# Patient Record
Sex: Male | Born: 1955
Health system: Southern US, Community
[De-identification: ages and names within clinical notes are randomized; demographics above are authoritative.]

## PROBLEM LIST (undated history)

## (undated) DIAGNOSIS — D689 Coagulation defect, unspecified: Secondary | ICD-10-CM

## (undated) DIAGNOSIS — R0989 Other specified symptoms and signs involving the circulatory and respiratory systems: Secondary | ICD-10-CM

## (undated) DIAGNOSIS — K469 Unspecified abdominal hernia without obstruction or gangrene: Secondary | ICD-10-CM

## (undated) DIAGNOSIS — I10 Essential (primary) hypertension: Secondary | ICD-10-CM

## (undated) DIAGNOSIS — Z973 Presence of spectacles and contact lenses: Secondary | ICD-10-CM

## (undated) DIAGNOSIS — I2699 Other pulmonary embolism without acute cor pulmonale: Secondary | ICD-10-CM

## (undated) DIAGNOSIS — IMO0002 Reserved for concepts with insufficient information to code with codable children: Secondary | ICD-10-CM

## (undated) DIAGNOSIS — Z86718 Personal history of other venous thrombosis and embolism: Secondary | ICD-10-CM

## (undated) DIAGNOSIS — D6851 Activated protein C resistance: Secondary | ICD-10-CM

## (undated) DIAGNOSIS — B356 Tinea cruris: Secondary | ICD-10-CM

## (undated) DIAGNOSIS — I499 Cardiac arrhythmia, unspecified: Secondary | ICD-10-CM

## (undated) HISTORY — DX: Unspecified abdominal hernia without obstruction or gangrene: K46.9

## (undated) HISTORY — DX: Other specified symptoms and signs involving the circulatory and respiratory systems: R09.89

## (undated) HISTORY — DX: Reserved for concepts with insufficient information to code with codable children: IMO0002

## (undated) HISTORY — DX: Coagulation defect, unspecified: D68.9

## (undated) HISTORY — DX: Other pulmonary embolism without acute cor pulmonale: I26.99

## (undated) HISTORY — DX: Activated protein C resistance: D68.51

## (undated) HISTORY — DX: Personal history of other venous thrombosis and embolism: Z86.718

## (undated) HISTORY — PX: OTHER SURGICAL HISTORY: SHX169

## (undated) HISTORY — DX: Presence of spectacles and contact lenses: Z97.3

## (undated) HISTORY — PX: HAND SURGERY: SHX662

---

## 1995-03-19 DIAGNOSIS — I2699 Other pulmonary embolism without acute cor pulmonale: Secondary | ICD-10-CM

## 1995-03-19 HISTORY — PX: OTHER SURGICAL HISTORY: SHX169

## 1995-03-19 HISTORY — DX: Other pulmonary embolism without acute cor pulmonale: I26.99

## 1998-10-24 ENCOUNTER — Ambulatory Visit: Admission: RE | Admit: 1998-10-24 | Discharge: 1998-10-24 | Payer: Self-pay | Admitting: Internal Medicine

## 2007-01-14 ENCOUNTER — Encounter (HOSPITAL_BASED_OUTPATIENT_CLINIC_OR_DEPARTMENT_OTHER): Admission: RE | Admit: 2007-01-14 | Discharge: 2007-03-24 | Payer: Self-pay | Admitting: Surgery

## 2007-02-02 ENCOUNTER — Ambulatory Visit: Payer: Self-pay | Admitting: Surgery

## 2007-02-23 ENCOUNTER — Ambulatory Visit: Payer: Self-pay | Admitting: Vascular Surgery

## 2007-03-02 ENCOUNTER — Ambulatory Visit: Payer: Self-pay | Admitting: Vascular Surgery

## 2007-03-30 ENCOUNTER — Ambulatory Visit: Payer: Self-pay | Admitting: Vascular Surgery

## 2007-10-20 ENCOUNTER — Ambulatory Visit: Payer: Self-pay | Admitting: Vascular Surgery

## 2010-05-07 ENCOUNTER — Encounter: Payer: Self-pay | Admitting: Vascular Surgery

## 2010-05-24 ENCOUNTER — Encounter (INDEPENDENT_AMBULATORY_CARE_PROVIDER_SITE_OTHER): Payer: BC Managed Care – PPO

## 2010-05-24 DIAGNOSIS — Z48812 Encounter for surgical aftercare following surgery on the circulatory system: Secondary | ICD-10-CM

## 2010-05-24 DIAGNOSIS — I8310 Varicose veins of unspecified lower extremity with inflammation: Secondary | ICD-10-CM

## 2010-05-28 ENCOUNTER — Encounter (INDEPENDENT_AMBULATORY_CARE_PROVIDER_SITE_OTHER): Payer: BC Managed Care – PPO | Admitting: Vascular Surgery

## 2010-05-28 DIAGNOSIS — I83893 Varicose veins of bilateral lower extremities with other complications: Secondary | ICD-10-CM

## 2010-05-28 NOTE — Assessment & Plan Note (Signed)
OFFICE VISIT  RODDY, BELLAMY DOB:  04-11-1955                                       05/28/2010 ZOXWR#:[id number]  The patient returns today for worsening of his venous insufficiency of the right leg for which he was treated in 2008.  He had laser ablation of his right great saphenous vein for recurrent venous stasis ulcer and severe venous hypertension secondary to valvular reflux in the right great saphenous system.  He had a good result early with immediate evidence of total closure of the right great saphenous vein although several months later a duplex exam did reveal partial recanalization of the proximal right great saphenous vein but closure of the distal right great saphenous vein.  He remains asymptomatic and had no recurrence of the stasis ulcer.  He also noticed that the skin became lighter in color in the area of hyperpigmentation.  Over the last 6 months he has continued to wear long-leg elastic compression stockings but has had increasing episodes of sharp pain beneath the thigh and calf area as well as bulging varicosities which were not present in the mid to distal thigh as well as darkening of the skin.  He has had no recurrence of stasis ulcer and has had no bleeding but has had worsening of the edema and pain.  This has worsened despite the use of stockings and ibuprofen and elevation of the legs.  He has no symptoms of the contralateral left leg.  This patient does have a factor V Leiden deficiency and is on chronic Coumadin therapy.  Previously we did not discontinue his Coumadin completely but did hold it for a few days with his previous procedure and he tolerated that well.  He does have a remote history of DVT and pulmonary embolism many years ago.  He originally had ligation and stripping of the vein in [place] in 1997.  CHRONIC MEDICAL PROBLEMS: 1. Factor V Leiden. 2. History of PE. 3. History of DVT. 4. Denies any coronary  artery disease, diabetes or stroke.  FAMILY HISTORY:  Noncontributory.  SOCIAL HISTORY:  He is married, works as a Medical illustrator.  Does not smoke.  REVIEW OF SYSTEMS:  He denies any chest pain, dyspnea on exertion, PND, orthopnea or hemoptysis.  All other systems are negative.  PHYSICAL EXAM:  Vital signs:  Blood pressure is 140/80, heart rate 70, respirations 15.  General:  He is a well-developed, well-nourished male in no apparent distress, alert and oriented x3.  HEENT:  Normal for age. EOMs intact.  Lungs:  Clear to auscultation.  No rhonchi or wheezing. Cardiovascular:  Regular rhythm without murmurs.  Carotid pulses 3+, no audible bruits.  Abdomen:  Soft, nontender with no palpable masses. Lower extremity exam reveals 3+ femoral, popliteal and dorsalis pedis pulses bilaterally.  Left leg is unremarkable.  Right leg has severe hyperpigmentation the lower third of the leg medially with no active venous stasis ulcers.  He has 1+ edema.  He has bulging varicosities in the right mid to distal thigh medially and extending over the patella and into the right medial calf area of the great saphenous system.  Today I ordered venous duplex exam which I reviewed and interpreted.  He has total recanalization of the left great saphenous vein from the saphenofemoral junction proximal to the mid thigh which is supplying these medial varicosities.  There is also a large branch of equal size (anterior accessory branch) which communicates just distal to the saphenofemoral junction and also has gross reflux feeding other varicosities in the anterior thigh.  He has some reflux in the small saphenous vein of the right calf.  I visualized the veins personally with the SonoSite today and believe that for complete treatment he needs closure of both the anterior accessory branch and the right great saphenous vein as well as stab phlebectomies which we should perform as a single procedure.  He  has definitely developed recurrent symptoms which are affecting his daily living despite good medical management and we will proceed with precertification to perform this in the near future.    Quita Skye [name], M.D. Electronically Signed  JDL/MEDQ  D:  05/28/2010  T:  05/28/2010  Job:  [id number]

## 2010-06-04 NOTE — Procedures (Unsigned)
LOWER EXTREMITY VENOUS REFLUX EXAM  INDICATION:  Followup right great saphenous vein ablation 02/2007.  EXAM:  Using color-flow imaging and pulse Doppler spectral analysis, the right common femoral, superficial femoral, popliteal, posterior tibial, greater and small saphenous veins were evaluated.  There is evidence suggesting deep venous insufficiency in the right lower extremity.  The right saphenofemoral junction is not competent with reflux of >500 milliseconds.  The right GSV is not competent with reflux of >500 milliseconds with the caliber as described below.  The right proximal short saphenous vein demonstrates incompetency.  GSV Diameter (used if found to be incompetent only)                                           Right    Left Proximal Greater Saphenous Vein           1.25. cm cm Proximal-to-mid-thigh                     0.53 cm  cm Mid thigh                                 0.74 cm  cm Mid-distal thigh                          cm       cm Distal thigh                              0.89 cm  cm Knee                                      0.47 cm  cm  IMPRESSION: 1. The right great saphenous vein is not competent with reflux of >500     milliseconds. 2. The right great saphenous vein is tortuous. 3. The deep venous system is not competent with reflux of >500     milliseconds. 4. The right small saphenous vein is not competent with reflux of >500     milliseconds.  ___________________________________________ Quita Skye [name], M.D.  EM/MEDQ  D:  05/25/2010  T:  05/25/2010  Job:  [id number]

## 2010-06-18 ENCOUNTER — Encounter: Payer: Self-pay | Admitting: Vascular Surgery

## 2010-06-26 ENCOUNTER — Other Ambulatory Visit: Payer: BC Managed Care – PPO | Admitting: Vascular Surgery

## 2010-07-03 ENCOUNTER — Ambulatory Visit: Payer: BC Managed Care – PPO | Admitting: Vascular Surgery

## 2010-07-31 NOTE — Assessment & Plan Note (Signed)
OFFICE VISIT   Todd Gardner, Todd Gardner  DOB:  [birth year]                                       02/02/2007  WFUXN#:[id number]   REASON FOR VISIT:  Right venous stasis ulcer.   HISTORY:  This is a 55 year old gentleman I am seeing at the request of  Dr. Tanda Rockers for evaluation of right lower extremity venous stasis ulcer.  The patient has had 2 prior ulcers in 2002 and 2006.  This is his third  ulcer, which has been present for approximately 6 weeks.  In the past,  he has undergone vein stripping in [place].  This was done in 1997.  He  is currently managing his ulcer at the [facility] Center with compression  dressing.  He states that the ulcer has not changed significantly in the  past 6 weeks.   The patient has been compliant with wearing graduated compression  stockings, 20-30 mm of compression of thigh-high length for the past 7  years.  He has developed these ulcers despite religiously wearing  compression.  He now requires a Fentanyl patch, as well as hydrocodone  p.r.n. for pain management.  He states that there has been some bleeding  on the bandage from when the dressing change is performed.  The patient  has a history of Factor V Leiden deficiency, for which he takes  Coumadin.  He has had a DVT and pulmonary embolism in the past.   REVIEW OF SYSTEMS:  GENERAL:  No weight gain or weight loss.  CARDIAC:  Negative.  PULMONARY:  Negative.  GASTROINTESTINAL:  Negative.  GENITOURINARY:  Negative.  VASCULAR:  Pain in the legs with walking.  Pain in the feet with lying  flat, and non-healing ulcers.  Positive for blood clots and phlebitis in  the past.  NEURO:  Negative.  ORTHO:  Negative.  PSYCH:  Negative.  ENT:  Negative.  HEMATOLOGICAL:  Positive for Factor V Leiden mutation.   PAST MEDICAL HISTORY:  Significant for Factor V Leiden mutation, history  of a PE, and a history of recurrent venous stasis ulcers.   PAST SURGICAL HISTORY:  Right greater  saphenous vein stripping in  [place] in 1997.   FAMILY HISTORY:  Noncontributory.   SOCIAL HISTORY:  He is married.  He works as a Medical illustrator.  Does not  smoke.  Has never smoked.  Drinks occasional alcohol.   MEDICATIONS:  Hydrocodone.  Fentanyl.  Coumadin.   ALLERGIES:  Questionable allergy to local anesthetic.   PHYSICAL EXAM:  Vital signs:  Blood pressure 143/92, respirations 18,  pulse 70.  In general, well-appearing in no acute distress.  HEENT,  normocephalic, atraumatic.  Pupils are anicteric.  Neck is supple  without mass.  Cardiovascular is regular rate and rhythm.  Respirations  nonlabored.  Psych, he is alert and oriented x3.  Neuro, cranial nerves  2-12 are grossly intact.  Extremities, the right leg has a moderate  amount of swelling.  There was an approximately 3x3 cm ulcer on the  medial aspect of the right ankle.  There was bleeding from the tissue  bed.  There is no gross evidence of cellulitis.  There is a biofilm  covering the surface of the ulcer.  There are multiple prominent  varicosities in the lower leg, particularly on the anterior medial side.  He has a palpable dorsalis  pedis pulse.  Hemosiderin deposits are  present on the medial side of the ankle.   DIAGNOSTIC STUDIES:  The patient underwent duplex today.  This reveals  reflux in the greater saphenous vein.  He also refluxes in the femoral  and femoral vein.  There is no evidence of DVT.   ASSESSMENT AND PLAN:  CEAP class 6 right ankle venous stasis ulcer in  the setting of venous insufficiency.  At this point, the patient has  failed nonoperative procedures.  He has developed his third recurrent  ulcer despite wearing thigh-high compression stockings religiously.  He  also has had bleeding from the ulcer bed, as well as a significant  dependence on narcotics for pain control.  He is currently in a 4-layer  Profore placed by Dr. Tanda Rockers at the [facility]ound Center.  At this point, I  would recommend  proceeding with laser ablation of the greater saphenous  vein as well as stab phlebectomy of his numerous varicosities in an  attempt to heal this wound and prevent future recurrence.   The patient does have a history of Factor V Leiden gene mutation for  which he is on Coumadin.  I will give him a prescription of Lovenox,  which he can use to come off of the Coumadin around the time of the  procedure.  The patient also states that he has had a bad reaction to  local anesthesia, which is most likely lidocaine.  This was associated  with severe pain and burning at the injection site.  For that reason, we  should probably try Marcaine for local anesthetic.  The patient will be  placed back in an [employer] today, and pending insurance approval, we  will proceed with saphenous ablation and varicosity ablation.   [name], MD  Electronically Signed   VWB/MEDQ  D:  02/02/2007  T:  02/03/2007  Job:  [id number]   cc:   Jake Shark A. Tanda Rockers, M.D.  Dario Guardian, M.D.

## 2010-07-31 NOTE — Assessment & Plan Note (Signed)
[facility]   NAME:  Todd Gardner, Todd Gardner                 ACCOUNT NO.:  [id number]   MEDICAL RECORD NO.:  [id number]      DATE OF BIRTH:  [birth year]   PHYSICIAN:  Theresia Majors. Tanda Rockers, M.D. VISIT DATE:  02/05/2007                                   OFFICE VISIT   SUBJECTIVE:  [name] is a 55 year old man with the venous stasis ulcer  involving the right medial ankle.  In the interim, he has  been seen in  consultation by Dr. Myra Gianotti IV at [facility]  in [place].  There has been a recommendation that the patient proceed  with laser ablation and stab phlebectomy.  In the interim, the patient  has discontinued the use of the Duragesic patch due to symptoms of  nausea.  He is relatively comfortable by the resumption of his p.o.  medications.  There has been no fever.  There have been no  cardiorespiratory symptoms.   OBJECTIVE:  Blood pressure is 144/83, respirations 16, pulse rate 75,  temperature is 98.6.  Inspection of the right medial ankle shows that  the chronic ulcer is persistent.  There has been no obvious extension of  the ulcer volumes or depth.  There is mild hyperemia.  There is still  some moderate discomfort to deep palpation.  Capillary refill is brisk.  Pedal pulses 3+.   ASSESSMENT:  Persistent venous hypertension and secondary ulceration.   PLAN:  We have encouraged the patient strongly to continue with the  course that has been proposed by Dr. Myra Gianotti IV.  In addition, we have  encouraged him to continue the use of the external compression hose to  control edema. In addition, we have provided him with several swaths of  a quarter inch felt which may be used to bolus over the ulcer in an  attempt to locally increase the cutaneous pressure.  Use of this  technique has been demonstrated to the patient to his satisfaction.  We  will continue to see him on a weekly basis for wound management until  which time his care will be  completely transferred to Dr. Juleen China IV, MD before his surgery and convalescence.  We have given the  patient opportunity to ask questions.  He seems to understand the  discussion. Expresses gratitude for having been seen in the clinic and  indicates that he will be compliant.      [name], M.D.  Electronically Signed     HAN/MEDQ  D:  02/05/2007  T:  02/05/2007  Job:  [id number]   cc:   [name], MD

## 2010-07-31 NOTE — Assessment & Plan Note (Signed)
[facility]   NAME:  Todd Gardner, ACHILLE                 ACCOUNT NO.:  [id number]   MEDICAL RECORD NO.:  [id number]      DATE OF BIRTH:  1955-06-30   PHYSICIAN:  Theresia Majors. Tanda Rockers, M.D. VISIT DATE:  01/29/2007                                   OFFICE VISIT   SUBJECTIVE:  Todd Gardner is a 55 year old man who we are evaluating for  recalcitrant stasis ulcers associated with postphlebitic changes.  In  the interim, we have treated him with an Unna boot compression.  He has  complained of level 10 pain.  He has been essentially incapacitated  since his last visit.  There has been no fever; however, there has been  moderate drainage prompting him to discontinue his wrap.   OBJECTIVE:  Blood pressure is 156/88, respirations 18, pulse rate 75,  temperature is 98.3.  Inspection of the right lower extremity shows the  chronic changes of stasis are persistent.  There is no evidence of  lymphangiitis, abscess or cellulitis.  The wound itself has a 100%  granulation with some minor desquamation at the periphery.  The pedal  pulse remains 3+.  There is 2+ edema   ASSESSMENT:  Stasis ulceration with severe pain related to venous  hypertension.   PLAN:  For pain relief, we will proceed with a Duragesic patch 50 mcg  every 72 hours.  The patient will continue his oxycodone for  breakthrough pain.  He has been encouraged to remain ambulatory, but to  avoid operating machinery, driving or participating in activities  requiring precise judgment.  We will continue to use compression in the  form of a Profore wrap with a Silverlon swath and Ultra Mide cream to  the skin.  We will also schedule this patient for an Apligraf in an  attempt to achieve closure.  He is also to continue to pursue the  consultation with the vein and vascular specialists to rule out the  concurrent presence of right iliac stenosis as the underlying culprit.   We have given the patient an opportunity to ask  questions regarding his  instruction and the clinical impression, he seems to understand,  expresses gratitude for having been seen in the clinic and indicates  that he will be compliant.      [name], M.D.  Electronically Signed     HAN/MEDQ  D:  01/29/2007  T:  01/29/2007  Job:  [id number]

## 2010-07-31 NOTE — Procedures (Signed)
DUPLEX DEEP VENOUS EXAM - LOWER EXTREMITY   INDICATION:  Follow up right greater saphenous vein ablation.   HISTORY:  Edema:  No.  Trauma/Surgery:  Yes.  Pain:  No.  PE:  Previous DVT:  No.  Anticoagulants:  Yes (Coumadin)  Other:   DUPLEX EXAM:                CFV   SFV   PopV  PTV    GSV                R  L  R  L  R  L  R   L  R  L  Thrombosis    o  o  o     o     o      +  Spontaneous   +  +  +     +     +      0  Phasic        +  +  +     +     +      0  Augmentation  +  +  +     +     +      0  Compressible  +  +  +     +     +      0  Competent     +  +  +     +     +      0   Legend:  + - yes  o - no  p - partial  D - decreased   IMPRESSION:  1. No evidence of deep venous thrombosis noted in the right leg.  2. Right greater saphenous vein, mid to distal, appears thrombosed,      proximal greater saphenous vein appears to have mild reflux.    _____________________________  Quita Skye. [name], M.D.   MG/MEDQ  D:  10/20/2007  T:  10/20/2007  Job:  [id number]

## 2010-07-31 NOTE — Assessment & Plan Note (Signed)
[facility]   NAME:  Todd Gardner, Todd Gardner NO.:  [id number]   MEDICAL RECORD NO.:  [id number]      DATE OF BIRTH:  1955/05/28   PHYSICIAN:  Maxwell Caul, M.D. VISIT DATE:  01/16/2007                                   OFFICE VISIT   HISTORY OF PRESENT ILLNESS:  Todd Gardner comes to [place] today as a new  patient.  He had been seen last week at [facility].  He did not realize that there was wound care center here.   The history he gives is that if he had long problems with venous stasis  and was having vein surgery in 1997 in [place], developed a pulmonary  embolism.  At that time, he was discovered to have Factor V Leiden, and  he has been Coumadin since.  He has had recurrent ulcers on his right  lower extremity.  He tells me he had a stasis ulcer some years ago on  his right medial leg that healed with compression.  Two years ago, he  had an ulcer in the same site as his current ulcer.  This healed with  weekly compression wraps which was done in [place].  This took roughly 8-  9 weeks.  Six weeks ago he developed an ulcer on the right medial ankle.  He has been treating this with graded pressure wraps in [place].  Unfortunately, his climbing around this was probably not very good as he  has been moving to [place] with his family from [place] and spent a  lot of time on his feet.  As mentioned, he was wrapped in [place] then  treated with Flagyl for coexistent infection, although, I do not have  any culture results.  He said this was anaerobic.  Last week he was  seen at [facility]  He was debrided, and he was being  treated with wraps that he is doing himself at home.   PAST MEDICAL HISTORY:  1. Reasonably simply is factor V Leiden with a history of pulmonary      embolism in 1997.  2. Recurrent venous stasis as discussion above.  3. Lumbar disk problems.   CURRENT MEDICATIONS:  Coumadin and  hydrocodone/APAP 5/500 one p.o. q.6 h  p.r.n. which he apparently took after debridement last week with very  little effect.  He was in extreme pain.   Socially, as mentioned, he is from [place], recently moved from  [place].  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  He is planning to make a career change here.  His wife is a  professor at [employer].   PHYSICAL EXAMINATION:  GENERAL:  A healthy, pleasant looking man in no  distress.  VITAL SIGNS:  Temperature is 97.9, pulse 59, respirations 16, blood  pressure 144/88.  He is not a diabetic.  RESPIRATORY:  Good air entry bilaterally.  CARDIAC:  Heart sounds are normal.  There is no murmurs.  No carotid  bruits.  Circulation:  His dorsalis pedis and posterior tibial pulses  were well felt without particular problems.   WOUND  EXAM:  One wound is on his right medial ankle, just under his  medial malleolus.  This measures 3 x 2.5 x 0.1.  There is no evidence of  surrounding infection.  The wound itself was covered with a tan-colored  eschar that underwent a full-thickness debridement with LET for  anesthesia hemostasis with direct pressure.  Underneath, this was a  granulating wound with a reasonably clean base.  There is a scant amount  of epithelialization here.  His leg had significant venous stasis  present but no other ulcers were noted.  His circulation was quite good  as noted above.   IMPRESSION:  1. Recurrent venous stasis ulcer on the right medial ankle.  This      underwent a full-thickness debridement as noted above.  We applied      Aquacel AG to the wound and wrapped him in a Profore wrap.  We will      see him again in a week's time.  He is familiar with this protocol.      He will call if there are any problems in the interim.           ______________________________  Maxwell Caul, M.D.     MGR/MEDQ  D:   01/16/2007  T:  01/16/2007  Job:  [id number]   cc:   Todd Gardner, M.D.

## 2010-07-31 NOTE — Assessment & Plan Note (Signed)
OFFICE VISIT   RAYE, SLYTER  DOB:  15-Feb-1956                                       02/16/2007  ZOXWR#:[id number]   REPORT TITLE:  Interim note   Mr. Castilleja has been approved for [place] to proceed with laser ablation of his  right greater saphenous vein for severe venous hypertension and a  history of multiple recurrent venous stasis ulcers in the right leg.  He  is on Coumadin for Factor V Leiden deficiency and has had previous DVT  and pulmonary embolus.  We will not discontinue his Coumadin but be  certain that the INR is not overly elevated, because it will be checked  this week.  He does not have a history of bleeding problems while on the  Coumadin.  And because he is at a risk of DVT and PE, we will maintain  the Coumadin.  It does not sound as if the burning discomfort from the  local anesthesia was an allergic reaction, so we will use Xylocaine with  epinephrine in the usual fashion for his ablation procedure.  I  discussed these situations with him by phone today, and he is ready to  proceed which we will schedule for next Monday, December 8.  He  understands that he will be at a slightly higher risk of some bleeding  from the procedure since he is on anticoagulation, but that the overall  risks will probably be less than discontinuing it with interim Lovenox.   Quita Skye [name], M.D.  Electronically Signed   JDL/MEDQ  D:  02/16/2007  T:  02/17/2007  Job:  [id number]

## 2010-07-31 NOTE — Assessment & Plan Note (Signed)
[facility]   NAME:  Todd Gardner, CASEBOLT                 ACCOUNT NO.:  [id number]   MEDICAL RECORD NO.:  [id number]      DATE OF BIRTH:  May 25, 1955   PHYSICIAN:  Theresia Majors. Tanda Rockers, M.D. VISIT DATE:  02/26/2007                                   OFFICE VISIT   SUBJECTIVE:  Todd Gardner underwent a laser ablation of the saphenous vein  residuals of the right lower extremity by Dr. Myra Gianotti  on February 23, 2007.  The patient kept his appointment although he has a follow-up  appointment with Dr. Myra Gianotti on March 06, 2007.  He is having some  mild discomfort after surgery, continues to wear a compressive hose.   OBJECTIVE:  Blood pressure is 120/81, respirations 18, pulse rate 82,  temperature is 98.7.  The patient is obviously in the early  postoperative state.  He has some areas of ecchymosis.  The wound  itself, even at this early date appears to have decreased significantly.  We abbreviated his exam due to the fact that he has had the recent  procedure.   ASSESSMENT:  Stasis ulcer immediately postop vein ablation by Dr.  Myra Gianotti.   PLAN:  We are discharging the patient from the Wound Center.  We have  encouraged him to keep his follow-up appointments as has been outlined  by Dr.  Myra Gianotti.  We have expressed a willingness to see him on a p.r.n.  basis.      [name], M.D.  Electronically Signed     HAN/MEDQ  D:  02/26/2007  T:  02/27/2007  Job:  [id number]

## 2010-07-31 NOTE — Assessment & Plan Note (Signed)
OFFICE VISIT   VITTORIO, MOHS  DOB:  [birth year]                                       03/30/2007  NFAOZ#:[id number]   Patient returns for further examination of the right lower extremity  stasis ulcer.  He had laser ablation of a greater saphenous vein  performed on December 8th and had prompt healing of the ulcer.   On examination today, the ulcer on the right ankle remains completely  healed, and the skin is beginning to soften.  The hyperpigmentation  appears to be changing slightly.  He has had no pain or discomfort in  the calf or thigh except for where the laser ablation procedure was  performed, and that seems to be almost resolved.  He has had no distal  edema.  He is wearing elastic compression stockings.   We photographed the site of the previous stasis ulcer today, and he will  return in six months for a formal venous duplex exam in the vascular lab  to check for reflux and to see me.   Quita Skye [name], M.D.  Electronically Signed   JDL/MEDQ  D:  03/30/2007  T:  03/31/2007  Job:  [id number]

## 2010-07-31 NOTE — Assessment & Plan Note (Signed)
[facility]   NAME:  Todd Gardner, Todd Gardner                 ACCOUNT NO.:  [id number]   MEDICAL RECORD NO.:  [id number]      DATE OF BIRTH:  07-29-1955   PHYSICIAN:  Theresia Majors. Tanda Rockers, M.D. VISIT DATE:  02/11/2007                                   OFFICE VISIT   SUBJECTIVE:  Todd Gardner is a 55 year old man whom we are following for  stasis ulcer involving the medial aspect of the right lower extremity.  The patient has been scheduled for vein ablation procedure by Dr.  Myra Gianotti the first week in December.  He returns for followup and  dressing change.  There has been no interim fever or excessive drainage.  Continues to be ambulatory.  He has utilized a modified compression  stocking.   OBJECTIVE:  Blood pressure is 129/90, respirations 16, pulse rate 80,  temperature is 98.7.  Inspection of the wound shows that the chronic changes of stasis are  persistent.  There is 2+ edema.  There is clean healthy-appearing  granulation in area of the previous ulcer with a hint of advancing  epithelium.  There is no evidence of ischemia or active infection.   ASSESSMENT:  Clinical response to compression.   PLAN:  We will continue to see the patient for interim evaluations until  he has his ablative vein surgery.  We have discussed the options of pain  management and the patient has been encouraged to continue the pain  regimens as indicated by Dr. Katrinka Blazing.  We have given him an opportunity to  ask questions.  He seems to understand the treatment plan.  He expresses  gratitude for having been seen in the clinic.  We will reevaluate him  pending the outcome of his venous ablative surgery.      [name], M.D.  Electronically Signed     HAN/MEDQ  D:  02/11/2007  T:  02/11/2007  Job:  [id number]   cc:   [name], MD  Dario Guardian, M.D.

## 2010-07-31 NOTE — Assessment & Plan Note (Signed)
OFFICE VISIT   DEANGLEO, PASSAGE  DOB:  02/19/1956                                       03/02/2007  EAVWU#:[id number]   Mr. Yeates in one week status post laser ablation of his right greater  saphenous vein. He had multiple stasis ulcers in the past and has had  previous venous surgery performed in [place]. He has been treated  actively for a venostasis ulcer at the right ankle and he has noticed  marked improvement just in the past 7 days since the procedure was  performed. The ulcerated area is healed and the area of erythema with  the skin replacement at the base of the ulcer now measures 5 x 4 cm with  no active ulcer infection. There is no edema in the ankle today. He  states that there is decreased eczema and scaliness involving the  hyperpigmented area, and that the skin seems more stabilized. He is  having moderate tenderness along the course of the greater saphenous  vein which is a quite large vein extending from the knee to the  saphenofemoral junction. Venous Duplex exam was performed by me which  reveals total occlusion of the saphenous vein from near the  saphenofemoral junction to the knee with the vein distally being patent.  The venous system is widely patent with no evidence of deep vein  thrombosis or obstruction. He was reassured regarding these findings.  Will return in 4 weeks for examination of the stasis ulcer and return in  6 months for a final venous Duplex exam. He will continue to wear the  long leg stockings for the next few weeks and then convert to the short  leg graduated compression stockings on a chronic basis.   Quita Skye [name], M.D.  Electronically Signed   JDL/MEDQ  D:  03/02/2007  T:  03/03/2007  Job:  [id number]   cc:   Jake Shark A. Tanda Rockers, M.D.  Dario Guardian, M.D.

## 2010-07-31 NOTE — Assessment & Plan Note (Signed)
OFFICE VISIT   MERVIN, RAMIRES  DOB:  22-Jul-1955                                       10/20/2007  EAVWU#:[id number]   The patient underwent laser ablation of his right great saphenous vein  with no stab phlebectomies on December 8 for a recurrent stasis ulcer in  the right ankle with severe hyperpigmentation.  He had dramatic  improvement with complete healing of the ulcer fairly rapidly.  The  hyperpigmentation in starting to face and his skin is softer with no  edema of the leg.  He states the leg felt like a new leg after 2 weeks  and could not be happier with the result.   PHYSICAL EXAMINATION:  On exam today there is no evidence of any  recurrent ulcer and skin is softer as noted with a palpable dorsalis  pedis pulse.  Venous duplex exam interestingly shows complete occlusion  of the great saphenous vein in the midportion of the leg and distally  but some recanalization of the great saphenous vein proximally near the  saphenofemoral junction with some reflux.   This does not seem to be clinically significant and I have discussed  this with him.  If he develops any recurrent symptoms or ulceration  obviously he will be back in touch with [place].   Quita Skye [name], M.D.  Electronically Signed   JDL/MEDQ  D:  10/20/2007  T:  10/21/2007  Job:  [id number]

## 2010-07-31 NOTE — Assessment & Plan Note (Signed)
[facility]   NAME:  Todd Gardner                 ACCOUNT NO.:  [id number]   MEDICAL RECORD NO.:  [id number]      DATE OF BIRTH:  1955/07/09   PHYSICIAN:  Todd Gardner, M.D. VISIT DATE:  01/23/2007                                   OFFICE VISIT   SUBJECTIVE:  Todd Gardner is a 55 year old man who is seen in followup for a  stasis ulceration involving his right lower extremity.  In the interim,  the patient has been treated with an Unna wrap.  He is required  hydrocodone APAP 5/500 every 6-8 hours for pain, and has increased this  frequency during the ambulatory periods.  There has been no interim  fever.   OBJECTIVE:  Blood pressure is 140/87, respirations 16, pulse rate 70,  temperature is 98.3.  Inspection of the right lower extremity in the supine position discloses  an area of ulceration with healthy-appearing granulation and advancing  epithelium.  There are chronic changes of stasis with hyperpigmentation  and induration with dermatoliposclerosis.  The pedal pulse remains  palpable.  When the patient stands there is extreme venous plethora and  tenseness of the lesser saphenous system with tributaries of 1-1/2 cm to  2 cm in diameter transverse in the lower leg.  These varicosities extend  onto the dorsum of the foot, and are prominent.  They are extremely  tense and they reproduce the pain upon palpation.  There are incisions  consistent with the saphenous vein-stripping both in the proximal and  distal thigh, and the midcalf medially.   ASSESSMENT:  Postphlebitic state with history of Leiden factor  deficiency.   PLAN:  We are recommending that this patient be referred to the vascular  surgery service for evaluation of possible iliac vein stenosis.  We will  defer any contrast study at this time.  We will continue to wrap his leg  on a weekly basis until he can be seen by the vein and vascular  specialist.   We have explained this approach to the  patient in terms that he seems to  understand.  He expresses gratitude for having been seen in the clinic,  and indicates that he is anxious to proceed with the consultation.      Todd Gardner, M.D.  Electronically Signed     HAN/MEDQ  D:  01/27/2007  T:  01/27/2007  Job:  [id number]   cc:   [facility]

## 2010-07-31 NOTE — Procedures (Signed)
LOWER EXTREMITY VENOUS REFLUX EXAM   INDICATION:  5 weeks of right foot ulcer.  The patient had right greater  saphenous vein stripped 10 years ago.   EXAM:  Using color-flow imaging and pulse Doppler spectral analysis, the  right common femoral, superficial femoral, popliteal, posterior tibial,  greater and lesser saphenous veins are evaluated.  There is evidence  suggesting deep venous insufficiency in the right common femoral vein  and superficial femoral vein.  The right saphenofemoral junction is not  competent.  The right greater saphenous vein is not competent with a  caliber as described below.   The right proximal short saphenous vein demonstrates competency.   GSV Diameter (used if found to be incompetent only)                                            Right    Left  Proximal Greater Saphenous Vein           1.2 cm   cm  Proximal-to-mid-thigh                     1.2 cm   cm  Mid thigh                                 1.2 cm   cm  Mid-distal thigh                          1.1 cm   cm  Distal thigh                              0.9 cm   cm  Knee                                      1 cm     cm   IMPRESSION:  1. Right greater saphenous vein reflux is identified with the caliber      ranging from 0.9 cm to 1.2 cm knee to groin.  2. 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  3. The right greater saphenous vein is not tortuous.  4. The deep venous system is not competent in the common femoral vein      and superficial femoral vein.  The right popliteal vein is      competent.  5. The right lesser saphenous vein is competent.   No evidence of right leg deep venous thrombosis, superficial venous  thrombus or Baker cyst.   ___________________________________________  V. Charlena Cross, MD   MC/MEDQ  D:  02/02/2007  T:  02/03/2007  Job:  [id number]

## 2010-09-25 ENCOUNTER — Encounter (INDEPENDENT_AMBULATORY_CARE_PROVIDER_SITE_OTHER): Payer: Self-pay | Admitting: General Surgery

## 2010-09-25 ENCOUNTER — Ambulatory Visit (INDEPENDENT_AMBULATORY_CARE_PROVIDER_SITE_OTHER): Payer: BC Managed Care – PPO | Admitting: General Surgery

## 2010-09-25 VITALS — BP 142/78 | HR 72 | Temp 96.8°F | Ht 75.0 in | Wt 222.6 lb

## 2010-09-25 DIAGNOSIS — D6851 Activated protein C resistance: Secondary | ICD-10-CM | POA: Insufficient documentation

## 2010-09-25 DIAGNOSIS — K4090 Unilateral inguinal hernia, without obstruction or gangrene, not specified as recurrent: Secondary | ICD-10-CM

## 2010-09-25 NOTE — Patient Instructions (Signed)
Please call our office when you are ready to schedule your operation.

## 2010-09-25 NOTE — Progress Notes (Addendum)
Subjective:     Patient ID: EMILY FORSE, male   DOB: October 21, 1955, 55 y.o.   MRN: [id number]    BP 142/78  Pulse 72  Temp 96.8 F (36 C)  Ht 6\' 3"  (1.905 m)  Wt 222 lb 9.6 oz (100.971 kg)  BMI 27.82 kg/m2    HPI This is a 55 year old male referred by Dr. Merri Brunette for evaluation of a left inguinal hernia. He noted some burning discomfort in the lower abdominal wall region on the left side. He went for his routine physical exam and was diagnosed with a left inguinal hernia. He's been sent over here for further evaluation and treatment. His brother had an inguinal hernia. He denies difficulty with urination. He denies constipation. No chronic cough.  Past Medical History  Diagnosis Date  . Hernia   . Poor circulation   . Hemorrhoids   . History of blood clots     Pulmonary embolism  . Wears glasses   . Factor V Leiden     positive  . Ulcer     leg    Past Surgical History  Procedure Date  . Vericose vein surgery   . Laser oblation     RLE saphenous vein   Allergies  Allergen Reactions  . Caine-1 (Lidocaine Hcl)   . Epinephrine   . Lanolin   . Lidocaine    Current outpatient prescriptions:warfarin (COUMADIN) 5 MG tablet, Take 5 mg by mouth daily.  , Disp: , Rfl:   History   Social History  . Marital Status: Married    Spouse Name: N/A    Number of Children: N/A  . Years of Education: N/A   Occupational History  . Not on file.   Social History Main Topics  . Smoking status: Never Smoker   . Smokeless tobacco: Not on file  . Alcohol Use: 1.2 oz/week    2 Cans of beer per week  . Drug Use: No  . Sexually Active:    Other Topics Concern  . Not on file   Social History Narrative  . No narrative on file   General:  _X_Normal __Fever__Weight change __Night sweats __Fatigue __Sleep loss  Breast:  _X_Normal __Lump __Pain __Nipple discharge __Infection __Other  Infectious Diseases:  _X_Normal __HIV/AIDS __Tuberculosis __Hepatitis A       __ Hepatitis B  __Hepatitis C __ STD __MRSA(staph infection) __Other  Dental:  _X_Normal __Dentures __Other  Cardiac: __Normal  __Pacemaker __Defibrillator __Bypass surgery __High blood pressure      __ Peripheral vascular disease __Heart attack __Irregular heart beat __Valve   Disease      _X_Varicose veins    Pulmonary:  _X_Normal __Cough/Sputum __Bronchitis __Asthma __COPD                    __Short of breath __Pneumonia __Sleep Apnea  Endocrine:  _X_Normal __Diabetes __Thyroid disease __High Cholesterol __Other  Skin:  _X_Normal  __Rash __Bruise easily __Cancer __Abnormal moles __Other  Gastrointestinal:  _X_Normal __Nausea/Vomiting/Diarrhea __Colon polyp or Cancer       __Irritable Bowel disease __Poor appetite __Hiatal hernia or reflux __Ulcer       __Liver disease __Abdominal pain __Hernia __Rectal bleeding or hemorrhoids       __Other  Genitourinary:  _X_Normal __Kidney disease or stones __Prostate problems        __Blood in urine __Difficulty voiding __Incontinence/leakage __Other  Neurological:  _X_Normal __Stroke __Paralysis __Seizure __Alzheimers disease       __Headaches __Dizziness __Fainting __Weakness __Other  Hematologic/Lymphatic:  __Normal __Bleeding disorder _X_Blood clots __Anemia       __Swollen lymph nodes __Blood Transfusion __Other  Immune System:  _X_Normal __Previous/Current Cancer-type:  __Other  HEENT:  __Normal __Hearing loss  __Hearing aid __Ear infection __Nose bleed       __Hoarseness __Sore throat __Blurred or double vision _X_Glasses or contacts       __Glaucoma __Retinopathy __Macular degeneration __Other  Musculoskeletal:  _X_Normal __Joint pain __Arthritis __Other  OB/GYN:  __Normal __Vaginal bleeding or discharge __Currently pregnant___        Trying to conceive___ 1st period age ___ Age at 89st pregnancy       ___ Breast feeding___  Menopause___ Hormones___        Breast cancer in family___        Review of Systems     Objective:   Physical  Exam  Constitutional: He appears well-developed and well-nourished. No distress.  Eyes: Conjunctivae and EOM are normal.  Neck: Normal range of motion. Neck supple.  Cardiovascular: Normal rate and regular rhythm.   No murmur heard. Pulmonary/Chest: Effort normal and breath sounds normal.  Abdominal: Soft. Bowel sounds are normal. He exhibits no distension and no mass. There is no tenderness.  Genitourinary: Penis normal.       There is a left inguinal bulge that is reducible in the supine position. The right inguinal floor is solid.  Musculoskeletal: He exhibits no edema.       Right-sided varicosities are noted.  Skin: Skin is warm and dry.       Assessment:     Left inguinal hernia that is symptomatic. Has factor V Leiden deficiency and is on chronic Coumadin for this. He is due to to get an operation on his right lower extremity varicose veins by Dr. [name].    Plan:    I told him he should proceed with this operation is varicose veins but Dr. [name] first. Following this I recommended he have a I. open left inguinal hernia with mesh. I told him I would want to talk with Dr. Katrinka Blazing regarding management of his anticoagulation before his surgery.  I've asked him to call me back when he is ready to schedule the operation.  I have explained the procedure, risks, and aftercare of inguinal hernia repair.  Risks include but are not limited to bleeding, infection, wound problems, anesthesia, recurrence, bladder or intestine injury, urinary retention, testicular dysfunction, chronic pain, mesh problems.  He seems to understand and agrees to proceed.        He underwent his varicose vein procedure. He is ready to schedule his left inguinal hernia repair. I spoke with Dr. Katrinka Blazing about his Coumadin. She recommends bridging him with Lovenox. We will schedule his surgery then discuss with him using Lovenox.

## 2010-10-04 ENCOUNTER — Encounter: Payer: Self-pay | Admitting: Vascular Surgery

## 2010-11-05 ENCOUNTER — Ambulatory Visit (INDEPENDENT_AMBULATORY_CARE_PROVIDER_SITE_OTHER): Payer: Self-pay | Admitting: Vascular Surgery

## 2010-11-05 VITALS — BP 125/82 | HR 89 | Resp 20 | Ht 75.0 in | Wt 220.0 lb

## 2010-11-05 DIAGNOSIS — I83893 Varicose veins of bilateral lower extremities with other complications: Secondary | ICD-10-CM

## 2010-11-05 NOTE — Progress Notes (Signed)
Laser ablation and stab phlebs 11/05/10

## 2010-11-06 ENCOUNTER — Telehealth: Payer: Self-pay | Admitting: *Deleted

## 2010-11-06 NOTE — Assessment & Plan Note (Signed)
OFFICE VISIT  Todd, Gardner DOB:  16-Feb-1956                                       11/05/2010 ZOXWR#:[id number]  The patient was scheduled for laser ablation of his right great saphenous vein and the anterior accessory branch of his right great saphenous vein plus multiple stab phlebectomies today.  He had previously had laser ablation in 2008 and had a remote history of vein stripping in [place] in 1997.  He has a history of stasis ulcers and severe bulging varicosities with hyperpigmentation distally.  Today I attempted to enter both the great saphenous vein on the right and the anterior accessory branch, both of which were either totally or partially occluded with some areas of recanalization.  There is some reflux in these areas but was unable to get the guidewire to pass proximally in either of these, with the anterior accessory branch being totally occluded up to near the junction.  After this was unsuccessfully attempted we then proceeded with approximately 30 stab phlebectomies in the right leg involving the thigh/medial calf area.  This was under local tumescent anesthesia.  He tolerated the procedure well.  He will return in 2 months for venous duplex exam to once again look at these areas, the right great saphenous and accessory branch.  He does have reflux in his right small saphenous vein.    Quita Skye [name], M.D. Electronically Signed  JDL/MEDQ  D:  11/05/2010  T:  11/06/2010  Job:  [id number]

## 2010-11-06 NOTE — Telephone Encounter (Signed)
Left a message checking on pt's status the am after his vein procedure. Asked him to call me if he had any probs or questions.

## 2010-11-12 ENCOUNTER — Ambulatory Visit: Payer: BC Managed Care – PPO | Admitting: Vascular Surgery

## 2010-11-13 ENCOUNTER — Encounter: Payer: Self-pay | Admitting: Pulmonary Disease

## 2010-11-14 ENCOUNTER — Ambulatory Visit (INDEPENDENT_AMBULATORY_CARE_PROVIDER_SITE_OTHER): Payer: BC Managed Care – PPO | Admitting: Pulmonary Disease

## 2010-11-14 ENCOUNTER — Encounter: Payer: Self-pay | Admitting: Pulmonary Disease

## 2010-11-14 VITALS — BP 114/80 | HR 86 | Temp 98.3°F | Ht 75.0 in | Wt 221.6 lb

## 2010-11-14 DIAGNOSIS — R05 Cough: Secondary | ICD-10-CM

## 2010-11-14 DIAGNOSIS — R059 Cough, unspecified: Secondary | ICD-10-CM | POA: Insufficient documentation

## 2010-11-14 NOTE — Assessment & Plan Note (Signed)
The patient has had a recent persistent cough after what sounds like an acute viral illness.  It is unclear whether this was a post viral bronchiolitis, whether he could possibly have underlying asthma, or whether this is simply a cyclical cough.  This has been an ongoing issue for him whenever he gets sick, and therefore I favor that he has a cyclical cough.  His chest x-ray showed questionable increased lucency in the upper lobes that may indicate emphysematous changes, however his spirometry today is totally normal.  He also has never smoked, and has no family history of early emphysema.  I would not be concerned with his chest x-ray findings at this time.  I have reviewed cyclical coughing with the patient, and have gone over the behavioral therapies that may keep the cough from escalating whenever he gets an acute viral illness.  I have asked the patient to come back and see me if he gets another viral illness which then leads to chronic coughing and other airway symptoms.  That way, we can do spirometry when he is acutely ill to see if there is anything to indicate a lower airway issue.

## 2010-11-14 NOTE — Progress Notes (Signed)
  Subjective:    Patient ID: Todd Gardner, male    DOB: 01/20/1956, 55 y.o.   MRN: [id number]  HPI The patient is a 55 year old male who I've been asked to see for a recently abnormal chest x-ray.  The patient was returning from a trip to [place] beginning of July, and started having a dry hacking cough.  He then began to have scant hemoptysis, and then increased quantity of white mucus.  He was treated with antibiotics and a short course of prednisone for presumed acute bronchitis, however his cough persisted.  It was primarily dry at that time, and was worse with prolonged conversations and strong odors.  He did have some coughing paroxysms, as well as mild intermittent shortness of breath.  He was tried on symbicort for possible bronchiolitis, and this actually made his cough worse.  He discontinued this 3 weeks ago.  He did have a chest x-ray that questioned emphysematous changes in the upper lung zones.  The patient had no breathing issues prior to this, and has no history of asthma.  He states that when he does get a chest cold, this tends to hang on with chronic dry cough.  He has never smoked, and has no family history of premature lung disease.   Review of Systems  Constitutional: Negative for fever and unexpected weight change.  HENT: Negative for ear pain, nosebleeds, congestion, sore throat, rhinorrhea, sneezing, trouble swallowing, dental problem, postnasal drip and sinus pressure.   Eyes: Negative for redness and itching.  Respiratory: Positive for cough. Negative for chest tightness, shortness of breath and wheezing.   Cardiovascular: Negative for palpitations and leg swelling.  Gastrointestinal: Negative for nausea and vomiting.  Genitourinary: Negative for dysuria.  Musculoskeletal: Negative for joint swelling.  Skin: Negative for rash.  Neurological: Negative for headaches.  Hematological: Does not bruise/bleed easily.  Psychiatric/Behavioral: Negative for dysphoric mood. The  patient is not nervous/anxious.        Objective:   Physical Exam Constitutional:  Well developed, no acute distress  HENT:  Nares patent without discharge  Oropharynx without exudate, palate and uvula are normal  Eyes:  Perrla, eomi, no scleral icterus  Neck:  No JVD, no TMG  Cardiovascular:  Normal rate, regular rhythm, no rubs or gallops.  No murmurs        Intact distal pulses  Pulmonary :  Normal breath sounds, no stridor or respiratory distress   No rales, rhonchi, or wheezing  Abdominal:  Soft, nondistended, bowel sounds present.  No tenderness noted.   Musculoskeletal:  No lower extremity edema noted.  Lymph Nodes:  No cervical lymphadenopathy noted  Skin:  No cyanosis noted  Neurologic:  Alert, appropriate, moves all 4 extremities without obvious deficit.         Assessment & Plan:

## 2010-11-14 NOTE — Patient Instructions (Signed)
Work on the behavioral methods we discussed when the cough begins to escalate:  Voice rest, no throat clearing, hard candy, etc. Would like to see you back when you are "sick" with similar symptoms, not after you have been treated.

## 2010-11-26 ENCOUNTER — Other Ambulatory Visit (HOSPITAL_COMMUNITY): Payer: Self-pay

## 2010-11-26 ENCOUNTER — Telehealth (INDEPENDENT_AMBULATORY_CARE_PROVIDER_SITE_OTHER): Payer: Self-pay | Admitting: General Surgery

## 2010-11-26 ENCOUNTER — Other Ambulatory Visit (INDEPENDENT_AMBULATORY_CARE_PROVIDER_SITE_OTHER): Payer: Self-pay | Admitting: General Surgery

## 2010-11-26 NOTE — Telephone Encounter (Signed)
Todd Gardner need to postpone his hernia repair. I've asked him to call back when he wants to reschedule this. Also asked him to leave a message with my assistant or myself so that we may schedule the bridging of his Coumadin with Lovenox.

## 2010-12-06 ENCOUNTER — Ambulatory Visit (HOSPITAL_COMMUNITY): Admission: RE | Admit: 2010-12-06 | Payer: BC Managed Care – PPO | Source: Ambulatory Visit | Admitting: General Surgery

## 2011-01-07 ENCOUNTER — Ambulatory Visit (INDEPENDENT_AMBULATORY_CARE_PROVIDER_SITE_OTHER): Payer: BC Managed Care – PPO | Admitting: *Deleted

## 2011-01-07 ENCOUNTER — Ambulatory Visit (INDEPENDENT_AMBULATORY_CARE_PROVIDER_SITE_OTHER): Payer: BC Managed Care – PPO | Admitting: Vascular Surgery

## 2011-01-07 ENCOUNTER — Encounter: Payer: Self-pay | Admitting: Vascular Surgery

## 2011-01-07 VITALS — BP 122/75 | HR 82 | Resp 18 | Ht 76.0 in | Wt 220.6 lb

## 2011-01-07 DIAGNOSIS — I83893 Varicose veins of bilateral lower extremities with other complications: Secondary | ICD-10-CM

## 2011-01-07 DIAGNOSIS — Z48812 Encounter for surgical aftercare following surgery on the circulatory system: Secondary | ICD-10-CM

## 2011-01-07 NOTE — Progress Notes (Signed)
Subjective:     Patient ID: Todd Gardner, male   DOB: [birth year], 55 y.o.   MRN: [id number]  HPI this 55 year old male patient returns for further evaluation of his severe venous insufficiency of the right leg his most recent procedure was greater than 20 stab phlebectomy formed 11/05/2010. He had an attempt at laser ablation of the anterior accessory branch of great saphenous vein and the great saphenous vein to both were partially thrombosed and access was unsuccessful he had previously had ligation and stripping of the right great saphenous vein in 1997 in [place]. He has had no change in distal edema since his last procedure. He has had no pain at the staff phlebectomy sites. He has noted some darkening of the skin near the lateral malleolus of the right ankle. He is wearing elastic compression stockings on a daily basis. Review of Systems     Objective:   Physical Exam blood pressure 122/75 heart rate 82 respirations 18 Right lower extremity exam is 3+ femoral and dorsalis pedis pulse.  There are no bulging varicosities noted in the right lower extremity. There are some thrombosed varicosities palpable in the medial aspect of the right thigh. He has some hyperpigmentation developing laterally posterior to the lateral malleolus with no skin changes. He has no active ulceration medially where his hyperpigmentation is chronic and extensive.  Today I ordered venous duplex exam which I reviewed and interpreted. There is severe reflux in the deep system. Areas some intermittent reflux near the saphenofemoral junction. There is partial closure of the GS vein and the anterior accessory branch.    Assessment:    post stab phlebectomy multiple vein right leg with history of laser ablation right leg and ligation and stripping right leg    Plan:    continue short-leg elastic compression stocking with elevation of legs at night Return to see [place] on when necessary basis

## 2011-01-14 NOTE — Procedures (Unsigned)
DUPLEX DEEP VENOUS EXAM - LOWER EXTREMITY  INDICATION:  Follow up right varicose vein stab phlebectomies.  HISTORY:  Edema:  No. Trauma/Surgery:  Yes, previous stripping. Pain:  No. PE:  No. Previous DVT:  Yes. Anticoagulants:  Yes. Other:  Factor Zenaida Niece Leiden disorder.  DUPLEX EXAM:               CFV   SFV   PopV  PTV    GSV               R  L  R  L  R  L  R   L  R  L Thrombosis    o     o     o     o      + Spontaneous   +     +     +     +      + Phasic        +     +     +     +      + Augmentation  +     +     +     +      + Compressible  +     +     +     +      o Competent     o     o     o     +      o  Legend:  + - yes  o - no  p - partial  D - decreased  IMPRESSION: 1. There is significant deep reflux observed in the right lower     extremity. 2. The right greater saphenous vein appears to have been ligated at     the junction; however, there are residual veins and a tortuous take-     off of the great saphenous vein, which are all incompetent. 3. Portions of the anterior saphenous vein and the great saphenous     vein appear thrombosed, but significant reflux is still observed     throughout.   _____________________________ Quita Skye. [name], M.D.  LT/MEDQ  D:  01/07/2011  T:  01/07/2011  Job:  [id number]

## 2011-12-09 ENCOUNTER — Ambulatory Visit (INDEPENDENT_AMBULATORY_CARE_PROVIDER_SITE_OTHER): Payer: BC Managed Care – PPO | Admitting: General Surgery

## 2011-12-09 ENCOUNTER — Encounter (INDEPENDENT_AMBULATORY_CARE_PROVIDER_SITE_OTHER): Payer: Self-pay | Admitting: General Surgery

## 2011-12-09 VITALS — BP 150/80 | HR 100 | Temp 98.2°F | Resp 20 | Ht 76.0 in | Wt 222.6 lb

## 2011-12-09 DIAGNOSIS — K4090 Unilateral inguinal hernia, without obstruction or gangrene, not specified as recurrent: Secondary | ICD-10-CM

## 2011-12-09 NOTE — Progress Notes (Signed)
Patient ID: Todd Gardner, male   DOB: 12/16/1955, 56 y.o.   MRN: [id number]  Chief Complaint  Patient presents with  . Pre-op Exam    reck hernia / schedule sx    HPI Todd Gardner is a 56 y.o. male.   HPI  He is an established patient with a left inguinal hernia. I last saw him in July of 2012. At that time he was having pain from a left inguinal hernia secondary to a cough. Eventually, the cough was able to be treated and resolved. His pain decreased. Now however he has return of the pain was specific movements from the hernia. He still has a swelling present. He is interested in having the hernia repaired.   Past Medical History  Diagnosis Date  . Hernia   . Poor circulation   . Hemorrhoids   . History of blood clots     Pulmonary embolism  . Wears glasses   . Factor V Leiden     positive  . Ulcer     leg  . Pulmonary embolism 1997  . Clotting disorder     Past Surgical History  Procedure Date  . Vericose vein surgery 1997    R leg  . Hand surgery     Left  . Vein ablation surgery 8/12 and 12/08  . Teeth extraction 01-01-2011  3 teeth extracted by oral surgeon    Family History  Problem Relation Age of Onset  . Colon cancer Sister   . Cancer Maternal Grandfather     colon    Social History History  Substance Use Topics  . Smoking status: Never Smoker   . Smokeless tobacco: Never Used  . Alcohol Use: 1.2 oz/week    2 Cans of beer per week    Allergies  Allergen Reactions  . Lidocaine     topical    Current Outpatient Prescriptions  Medication Sig Dispense Refill  . budesonide-formoterol (SYMBICORT) 80-4.5 MCG/ACT inhaler Inhale 2 puffs into the lungs 2 (two) times daily.      . hydrocodone-acetaminophen (LORCET-HD) 5-500 MG per capsule Take 1 capsule by mouth every 6 (six) hours as needed.        . predniSONE (DELTASONE) 20 MG tablet Take 20 mg by mouth daily.      . warfarin (COUMADIN) 5 MG tablet Take 5 mg by mouth daily.          Review of  Systems Review of Systems  Constitutional: Negative.   Respiratory: Negative.   Cardiovascular: Negative.   Genitourinary: Negative for difficulty urinating.    Blood pressure 150/80, pulse 100, temperature 98.2 F (36.8 C), temperature source Temporal, resp. rate 20, height 6' 4" (1.93 m), weight 222 lb 9.6 oz (100.971 kg).  Physical Exam Physical Exam  Constitutional: He appears well-developed and well-nourished. No distress.  HENT:  Head: Normocephalic and atraumatic.  Abdominal: Soft. There is no tenderness.       No umbilical hernia.  Genitourinary:       There is a reducible left inguinal bulge present. There is no right inguinal bulge.    Data Reviewed Old note.  Assessment    Symptomatic left inguinal hernia. He also has factor V Leiden deficiency and is on chronic Coumadin.    Plan    Open left inguinal hernia repair with mesh. I will speak with Dr. Smith regarding his perioperative plan for anticoagulation.  I have explained the procedure, risks, and aftercare of inguinal hernia repair.    Risks include but are not limited to bleeding (higher risk in him), infection, wound problems, anesthesia, recurrence, bladder or intestine injury, urinary retention, testicular dysfunction, chronic pain, mesh problems.  He seems to understand and agrees to proceed.       Zaydon Kinser J 12/09/2011, 5:43 PM    

## 2011-12-09 NOTE — Patient Instructions (Signed)
We will call you to schedule your operation.

## 2011-12-12 ENCOUNTER — Encounter (INDEPENDENT_AMBULATORY_CARE_PROVIDER_SITE_OTHER): Payer: Self-pay | Admitting: General Surgery

## 2011-12-12 NOTE — Progress Notes (Unsigned)
Patient ID: Todd Gardner, male   DOB: 07/25/1955, 56 y.o.   MRN: [id number] I spoke with him about bridging his anticoagulation using Lovenox. Plan would be to have him stop his Coumadin 5 days before his planned procedure. 24 hours after stopping his Coumadin he can start  Lovenox injections once a day at 1.5 mg per kilogram.  I told him not to give himself a shot within 24 hours of the surgery. 24 hours after the surgery stop time he can restart the Lovenox injections and also restart his Coumadin. He can then have his INR checked in 5 days. He has given himself Lovenox injections before and is familiar with this.  He understands the plan. Once we have an operative date, I will call his pharmacy and arrange for him to get the Lovenox.

## 2011-12-13 ENCOUNTER — Telehealth (INDEPENDENT_AMBULATORY_CARE_PROVIDER_SITE_OTHER): Payer: Self-pay

## 2011-12-13 NOTE — Telephone Encounter (Signed)
Pt would like his Lovenox injection Rx called to his pharmacy on Monday. The pt is scheduled for 12/23/11.

## 2011-12-13 NOTE — Telephone Encounter (Signed)
Pt wants clarification about the exact dates of when to stop his Coumadin and start the Lovenox injections. The pt wants exact dates of when to do all of this so I told him to call Monday to speak to Dr Abbey Chatters.

## 2011-12-16 ENCOUNTER — Telehealth (INDEPENDENT_AMBULATORY_CARE_PROVIDER_SITE_OTHER): Payer: Self-pay

## 2011-12-16 ENCOUNTER — Encounter (INDEPENDENT_AMBULATORY_CARE_PROVIDER_SITE_OTHER): Payer: Self-pay | Admitting: General Surgery

## 2011-12-16 ENCOUNTER — Encounter (HOSPITAL_COMMUNITY): Payer: Self-pay | Admitting: Pharmacy Technician

## 2011-12-16 ENCOUNTER — Other Ambulatory Visit (INDEPENDENT_AMBULATORY_CARE_PROVIDER_SITE_OTHER): Payer: Self-pay | Admitting: General Surgery

## 2011-12-16 MED ORDER — ENOXAPARIN SODIUM 30 MG/0.3ML ~~LOC~~ SOLN: 1.5000 mg/kg | Freq: Every day | SUBCUTANEOUS | Status: DC

## 2011-12-16 NOTE — Progress Notes (Signed)
Patient ID: Todd Gardner, male   DOB: 1956-02-28, 56 y.o.   MRN: [id number] His operation is scheduled for October 7. I gave him instructions on when to stop his Coumadin and when to start the Lovenox injections. I have called in the Lovenox injections to his pharmacy.

## 2011-12-16 NOTE — Telephone Encounter (Signed)
Spoke with Psychologist, occupational in [place].  Pt's Rx for Lovenox 150mg  dispense #10 will be sent overnight to the patient.  Pt is aware.

## 2011-12-18 ENCOUNTER — Telehealth (INDEPENDENT_AMBULATORY_CARE_PROVIDER_SITE_OTHER): Payer: Self-pay

## 2011-12-18 NOTE — Telephone Encounter (Signed)
Dr Erby Pian request Dr Abbey Chatters call her re: pt. She is aware Dr Abbey Chatters is off this afternoon. She will call back tomorrow if she does not receive call back. She did not leave msg as to need. I advised her I will send msg to Dr Abbey Chatters and Cyndra Numbers.

## 2011-12-20 ENCOUNTER — Encounter (HOSPITAL_COMMUNITY): Payer: Self-pay

## 2011-12-20 ENCOUNTER — Encounter (HOSPITAL_COMMUNITY)
Admission: RE | Admit: 2011-12-20 | Discharge: 2011-12-20 | Disposition: A | Discharge status: Home / Self Care | Payer: BC Managed Care – PPO | Source: Ambulatory Visit | Attending: General Surgery | Admitting: General Surgery

## 2011-12-20 ENCOUNTER — Telehealth (INDEPENDENT_AMBULATORY_CARE_PROVIDER_SITE_OTHER): Payer: Self-pay | Admitting: General Surgery

## 2011-12-20 DIAGNOSIS — B356 Tinea cruris: Secondary | ICD-10-CM

## 2011-12-20 HISTORY — DX: Tinea cruris: B35.6

## 2011-12-20 LAB — CBC WITH DIFFERENTIAL/PLATELET
Basophils Absolute: 0.1 10*3/uL (ref 0.0–0.1)
Eosinophils Absolute: 0.2 10*3/uL (ref 0.0–0.7)
Eosinophils Relative: 2 % (ref 0–5)
HCT: 48.8 % (ref 39.0–52.0)
MCH: 32.4 pg (ref 26.0–34.0)
MCHC: 34.8 g/dL (ref 30.0–36.0)
MCV: 93.1 fL (ref 78.0–100.0)
Monocytes Absolute: 0.8 10*3/uL (ref 0.1–1.0)
Platelets: 224 10*3/uL (ref 150–400)
RDW: 12.6 % (ref 11.5–15.5)

## 2011-12-20 LAB — COMPREHENSIVE METABOLIC PANEL
ALT: 25 U/L (ref 0–53)
AST: 25 U/L (ref 0–37)
Calcium: 10 mg/dL (ref 8.4–10.5)
Creatinine, Ser: 1.04 mg/dL (ref 0.50–1.35)
GFR calc Af Amer: >90 mL/min (ref >90)
GFR calc non Af Amer: 78 mL/min — ABNORMAL LOW (ref >90)
Sodium: 137 mEq/L (ref 135–145)
Total Protein: 6.9 g/dL (ref 6.0–8.3)

## 2011-12-20 LAB — SURGICAL PCR SCREEN: MRSA, PCR: NEGATIVE

## 2011-12-20 LAB — PROTIME-INR: INR: 1.4 (ref 0.00–1.49)

## 2011-12-20 LAB — APTT: aPTT: 44 seconds — ABNORMAL HIGH (ref 24–37)

## 2011-12-20 NOTE — Pre-Procedure Instructions (Addendum)
12-20-11 1230 Note To Dr. Abbey Chatters in basket. Labs viewable in [place]. Pt. Will have repeat of PT/PTT on arrival AM of. W. Geanna Divirgilio,RN 12-20-11 1615 Pt. Notified of Positive PCR screen for Staph aureus-will fill Rx. For Mupirocin and use as directed.W. Kennon Portela

## 2011-12-20 NOTE — Telephone Encounter (Signed)
Spoke with the patient and reinforced that Dr. Abbey Chatters would give him instructions on his Coumadin the day of surgery which is 12/23/11.

## 2011-12-20 NOTE — Patient Instructions (Addendum)
[name]  12/20/2011   Your procedure is scheduled on:  10-7 -2013  Report to [facility] at      0530  AM .  Call this number if you have problems the morning of surgery: [phone]  Or Presurgical Testing [phone](Dacie Mandel)   Remember:   Do not eat food:After Midnight.    Take these medicines the morning of surgery with A SIP OF WATER: use Systane if needed   Do not wear jewelry, make-up or nail polish.  Do not wear lotions, powders, or perfumes. You may wear deodorant.  Do not shave 48 hours prior to surgery.(face and neck okay, no shaving of legs)  Do not bring valuables to the hospital.  Contacts, dentures or bridgework may not be worn into surgery.  Leave suitcase in the car. After surgery it may be brought to your room.  For patients admitted to the hospital, checkout time is 11:00 AM the day of discharge.   Patients discharged the day of surgery will not be allowed to drive home. Must have responsible person with you x 24 hours once discharged.  Name and phone number of your driver:Kathy Yordy, spouse [phone] cell   Special Instructions: CHG Shower Use Special Wash: see special instructions(avoid face and genitals)   Please read over the following fact sheets that you were given: MRSA Information.

## 2011-12-20 NOTE — Telephone Encounter (Signed)
Pt called to ask if Dr. Abbey Chatters called in any medication [facility] for pt after surgery? Pt did not know the name of medication. Also when to restart his Coumadin?. I reviewed this with Dr. Abbey Chatters in OR. He said he did not call any medication into pharmacy for after surgery, this will be addressed at time of surgery, also Dr. Abbey Chatters will give instructions for coumadin restart at time of surgery/at discharge/ per Dr. Abbey Chatters. Cyndra Numbers will contact patient regarding this message/gy

## 2011-12-23 ENCOUNTER — Ambulatory Visit (HOSPITAL_COMMUNITY): Payer: BC Managed Care – PPO | Admitting: Anesthesiology

## 2011-12-23 ENCOUNTER — Ambulatory Visit (HOSPITAL_COMMUNITY)
Admission: RE | Admit: 2011-12-23 | Discharge: 2011-12-23 | Disposition: A | Discharge status: Home / Self Care | Payer: BC Managed Care – PPO | Source: Ambulatory Visit | Attending: General Surgery | Admitting: General Surgery

## 2011-12-23 ENCOUNTER — Encounter (HOSPITAL_COMMUNITY): Payer: Self-pay | Admitting: Anesthesiology

## 2011-12-23 ENCOUNTER — Encounter (HOSPITAL_COMMUNITY)
Admission: RE | Disposition: A | Discharge status: Home / Self Care | Payer: Self-pay | Source: Ambulatory Visit | Attending: General Surgery

## 2011-12-23 DIAGNOSIS — Z7901 Long term (current) use of anticoagulants: Secondary | ICD-10-CM | POA: Insufficient documentation

## 2011-12-23 DIAGNOSIS — Z86711 Personal history of pulmonary embolism: Secondary | ICD-10-CM | POA: Insufficient documentation

## 2011-12-23 DIAGNOSIS — D6859 Other primary thrombophilia: Secondary | ICD-10-CM | POA: Insufficient documentation

## 2011-12-23 DIAGNOSIS — K4090 Unilateral inguinal hernia, without obstruction or gangrene, not specified as recurrent: Secondary | ICD-10-CM | POA: Insufficient documentation

## 2011-12-23 DIAGNOSIS — Z01812 Encounter for preprocedural laboratory examination: Secondary | ICD-10-CM | POA: Insufficient documentation

## 2011-12-23 HISTORY — PX: INGUINAL HERNIA REPAIR: SHX194

## 2011-12-23 LAB — APTT: aPTT: 32 seconds (ref 24–37)

## 2011-12-23 LAB — PROTIME-INR: INR: 0.95 (ref 0.00–1.49)

## 2011-12-23 SURGERY — REPAIR, HERNIA, INGUINAL, ADULT
Anesthesia: General | Site: Groin | Laterality: Left | Wound class: Clean

## 2011-12-23 MED ORDER — FENTANYL CITRATE 0.05 MG/ML IJ SOLN
25.0000 ug | INTRAMUSCULAR | Status: DC | PRN
  Administered 2011-12-23 (×2): 50 ug via INTRAVENOUS

## 2011-12-23 MED ORDER — SUCCINYLCHOLINE CHLORIDE 20 MG/ML IJ SOLN
INTRAMUSCULAR | Status: DC | PRN
  Administered 2011-12-23: 100 mg via INTRAVENOUS

## 2011-12-23 MED ORDER — BUPIVACAINE-EPINEPHRINE 0.5% -1:200000 IJ SOLN
INTRAMUSCULAR | Status: DC | PRN
  Administered 2011-12-23: 20 mL

## 2011-12-23 MED ORDER — NEOSTIGMINE METHYLSULFATE 1 MG/ML IJ SOLN
INTRAMUSCULAR | Status: DC | PRN
  Administered 2011-12-23: 3 mg via INTRAVENOUS

## 2011-12-23 MED ORDER — MIDAZOLAM HCL 5 MG/5ML IJ SOLN
INTRAMUSCULAR | Status: DC | PRN
  Administered 2011-12-23: 2 mg via INTRAVENOUS

## 2011-12-23 MED ORDER — CEFAZOLIN SODIUM 1-5 GM-% IV SOLN
INTRAVENOUS | Status: AC
  Filled 2011-12-23: qty 50

## 2011-12-23 MED ORDER — ONDANSETRON HCL 4 MG/2ML IJ SOLN
INTRAMUSCULAR | Status: DC | PRN
  Administered 2011-12-23: 4 mg via INTRAVENOUS

## 2011-12-23 MED ORDER — GLYCOPYRROLATE 0.2 MG/ML IJ SOLN
INTRAMUSCULAR | Status: DC | PRN
  Administered 2011-12-23: 0.4 mg via INTRAVENOUS

## 2011-12-23 MED ORDER — LACTATED RINGERS IV SOLN: INTRAVENOUS | Status: DC

## 2011-12-23 MED ORDER — ACETAMINOPHEN 10 MG/ML IV SOLN
INTRAVENOUS | Status: AC
  Filled 2011-12-23: qty 100

## 2011-12-23 MED ORDER — ROCURONIUM BROMIDE 100 MG/10ML IV SOLN
INTRAVENOUS | Status: DC | PRN
  Administered 2011-12-23: 30 mg via INTRAVENOUS

## 2011-12-23 MED ORDER — 0.9 % SODIUM CHLORIDE (POUR BTL) OPTIME
TOPICAL | Status: DC | PRN
  Administered 2011-12-23: 1000 mL

## 2011-12-23 MED ORDER — FENTANYL CITRATE 0.05 MG/ML IJ SOLN
INTRAMUSCULAR | Status: DC | PRN
  Administered 2011-12-23: 100 ug via INTRAVENOUS

## 2011-12-23 MED ORDER — ACETAMINOPHEN 10 MG/ML IV SOLN
INTRAVENOUS | Status: DC | PRN
  Administered 2011-12-23: 1000 mg via INTRAVENOUS

## 2011-12-23 MED ORDER — CEFAZOLIN SODIUM-DEXTROSE 2-3 GM-% IV SOLR
2.0000 g | INTRAVENOUS | Status: AC
  Administered 2011-12-23: 2 g via INTRAVENOUS

## 2011-12-23 MED ORDER — PROMETHAZINE HCL 25 MG/ML IJ SOLN: 6.2500 mg | INTRAMUSCULAR | Status: DC | PRN

## 2011-12-23 MED ORDER — PROPOFOL 10 MG/ML IV BOLUS
INTRAVENOUS | Status: DC | PRN
  Administered 2011-12-23: 200 mg via INTRAVENOUS

## 2011-12-23 MED ORDER — FENTANYL CITRATE 0.05 MG/ML IJ SOLN
INTRAMUSCULAR | Status: AC
  Filled 2011-12-23: qty 2

## 2011-12-23 MED ORDER — MEPERIDINE HCL 50 MG/ML IJ SOLN: 6.2500 mg | INTRAMUSCULAR | Status: DC | PRN

## 2011-12-23 MED ORDER — SUCCINYLCHOLINE CHLORIDE 20 MG/ML IJ SOLN: INTRAMUSCULAR | Status: DC | PRN

## 2011-12-23 MED ORDER — OXYCODONE-ACETAMINOPHEN 5-325 MG PO TABS: 1.0000 | ORAL_TABLET | ORAL | Status: DC | PRN

## 2011-12-23 MED ORDER — BUPIVACAINE-EPINEPHRINE (PF) 0.5% -1:200000 IJ SOLN
INTRAMUSCULAR | Status: AC
  Filled 2011-12-23: qty 10

## 2011-12-23 MED ORDER — PROPOFOL 10 MG/ML IV BOLUS: INTRAVENOUS | Status: DC | PRN

## 2011-12-23 MED ORDER — LACTATED RINGERS IV SOLN
INTRAVENOUS | Status: DC | PRN
  Administered 2011-12-23 (×2): via INTRAVENOUS

## 2011-12-23 SURGICAL SUPPLY — 46 items
APL SKNCLS STERI-STRIP NONHPOA (GAUZE/BANDAGES/DRESSINGS) ×1
BENZOIN TINCTURE PRP APPL 2/3 (GAUZE/BANDAGES/DRESSINGS) ×2 IMPLANT
BLADE HEX COATED 2.75 (ELECTRODE) ×2 IMPLANT
BLADE SURG 15 STRL LF DISP TIS (BLADE) ×1 IMPLANT
BLADE SURG 15 STRL SS (BLADE) ×2
BLADE SURG SZ10 CARB STEEL (BLADE) ×2 IMPLANT
CANISTER SUCTION 2500CC (MISCELLANEOUS) ×2 IMPLANT
CLOTH BEACON ORANGE TIMEOUT ST (SAFETY) ×2 IMPLANT
CLSR STERI-STRIP ANTIMIC 1/2X4 (GAUZE/BANDAGES/DRESSINGS) ×1 IMPLANT
DECANTER SPIKE VIAL GLASS SM (MISCELLANEOUS) ×2 IMPLANT
DRAIN PENROSE 18X1/2 LTX STRL (DRAIN) IMPLANT
DRAPE INCISE IOBAN 66X45 STRL (DRAPES) ×2 IMPLANT
DRAPE LAPAROTOMY TRNSV 102X78 (DRAPE) ×2 IMPLANT
DRAPE UTILITY XL STRL (DRAPES) ×2 IMPLANT
DRESSING TELFA 8X3 (GAUZE/BANDAGES/DRESSINGS) IMPLANT
DRSG TEGADERM 2-3/8X2-3/4 SM (GAUZE/BANDAGES/DRESSINGS) IMPLANT
DRSG TEGADERM 4X4.75 (GAUZE/BANDAGES/DRESSINGS) ×1 IMPLANT
ELECT REM PT RETURN 9FT ADLT (ELECTROSURGICAL) ×2
ELECTRODE REM PT RTRN 9FT ADLT (ELECTROSURGICAL) ×1 IMPLANT
GLOVE ECLIPSE 8.0 STRL XLNG CF (GLOVE) ×2 IMPLANT
GLOVE INDICATOR 8.0 STRL GRN (GLOVE) ×4 IMPLANT
GOWN STRL NON-REIN LRG LVL3 (GOWN DISPOSABLE) ×2 IMPLANT
GOWN STRL REIN XL XLG (GOWN DISPOSABLE) ×4 IMPLANT
KIT BASIN OR (CUSTOM PROCEDURE TRAY) ×2 IMPLANT
MESH HERNIA 3X6 (Mesh General) ×1 IMPLANT
NDL HYPO 25X1 1.5 SAFETY (NEEDLE) ×1 IMPLANT
NEEDLE HYPO 25X1 1.5 SAFETY (NEEDLE) ×2 IMPLANT
NS IRRIG 1000ML POUR BTL (IV SOLUTION) ×2 IMPLANT
PACK BASIC VI WITH GOWN DISP (CUSTOM PROCEDURE TRAY) ×2 IMPLANT
PAD TELFA 2X3 NADH STRL (GAUZE/BANDAGES/DRESSINGS) ×1 IMPLANT
PENCIL BUTTON HOLSTER BLD 10FT (ELECTRODE) ×2 IMPLANT
PUMP ON Q 100MLX2ML/HR (PAIN MANAGEMENT) IMPLANT
SPONGE GAUZE 4X4 12PLY (GAUZE/BANDAGES/DRESSINGS) IMPLANT
SPONGE LAP 4X18 X RAY DECT (DISPOSABLE) ×2 IMPLANT
STRIP CLOSURE SKIN 1/2X4 (GAUZE/BANDAGES/DRESSINGS) ×2 IMPLANT
SUT MNCRL AB 4-0 PS2 18 (SUTURE) ×2 IMPLANT
SUT PROLENE 2 0 CT2 30 (SUTURE) ×4 IMPLANT
SUT VIC AB 2-0 SH 18 (SUTURE) ×2 IMPLANT
SUT VIC AB 3-0 54XBRD REEL (SUTURE) ×1 IMPLANT
SUT VIC AB 3-0 BRD 54 (SUTURE) ×2
SUT VIC AB 3-0 SH 27 (SUTURE) ×4
SUT VIC AB 3-0 SH 27XBRD (SUTURE) ×2 IMPLANT
SYR BULB IRRIGATION 50ML (SYRINGE) ×2 IMPLANT
SYR CONTROL 10ML LL (SYRINGE) ×2 IMPLANT
TOWEL OR 17X26 10 PK STRL BLUE (TOWEL DISPOSABLE) ×2 IMPLANT
YANKAUER SUCT BULB TIP 10FT TU (MISCELLANEOUS) ×2 IMPLANT

## 2011-12-23 NOTE — Anesthesia Preprocedure Evaluation (Addendum)
Anesthesia Evaluation  Patient identified by MRN, date of birth, ID band Patient awake    Reviewed: Allergy & Precautions, H&P , NPO status , Patient's Chart, lab work & pertinent test results  Airway Mallampati: II TM Distance: >3 FB Neck ROM: Full    Dental No notable dental hx.    Pulmonary neg pulmonary ROS,  breath sounds clear to auscultation  Pulmonary exam normal       Cardiovascular negative cardio ROS  Rhythm:Regular Rate:Normal     Neuro/Psych negative neurological ROS  negative psych ROS   GI/Hepatic negative GI ROS, Neg liver ROS, GERD-  Poorly Controlled,  Endo/Other  negative endocrine ROS  Renal/GU negative Renal ROS  negative genitourinary   Musculoskeletal negative musculoskeletal ROS (+)   Abdominal   Peds negative pediatric ROS (+)  Hematology negative hematology ROS (+) Factor V Leiden   Anesthesia Other Findings   Reproductive/Obstetrics negative OB ROS                          Anesthesia Physical Anesthesia Plan  ASA: II  Anesthesia Plan: General   Post-op Pain Management:    Induction: Intravenous  Airway Management Planned: Oral ETT  Additional Equipment:   Intra-op Plan:   Post-operative Plan: Extubation in OR  Informed Consent: I have reviewed the patients History and Physical, chart, labs and discussed the procedure including the risks, benefits and alternatives for the proposed anesthesia with the patient or authorized representative who has indicated his/her understanding and acceptance.   Dental advisory given  Plan Discussed with: CRNA  Anesthesia Plan Comments:        Anesthesia Quick Evaluation

## 2011-12-23 NOTE — Preoperative (Signed)
Beta Blockers   Reason not to administer Beta Blockers:Not Applicable 

## 2011-12-23 NOTE — Op Note (Signed)
Preoperative diagnosis:  Left inguinal hernia.  Postoperative diagnosis:  Same (Direct hernia)  Procedure:  Left inguinal hernia repair with mesh.  Surgeon:  Avel Peace, M.D.  Anesthesia:  General/LMA with local (Marcaine).  Indication:  This is a 56 year old male with a factor V Leiden deficiency on chronic anticoagulation therapy who has an increasingly symptomatic left inguinal hernia. He now presents for repair. He has been on Lovenox injections preoperatively and off his Coumadin.  Technique:  The patient was seen in the holding room and the left groin was marked with my initials. The patient was brought to the operating, placed supine on the operating table, and the anesthetic was administered by the anesthesiologist. The hair in the groin area was clipped as was felt to be necessary. This area was then sterilely prepped and draped.  Local anesthetic was infiltrated in the superficial and deep tissues in the left groin.  An incision was made through the skin and subcutaneous tissue until the external oblique aponeurosis was identified.  Local anesthetic was infiltrated deep to the external oblique aponeurosis. The external oblique aponeurosis was divided through the external ring medially and back toward the anterior superior iliac spine laterally. Using blunt dissection, the shelving edge of the inguinal ligament was identified inferiorly and the internal oblique aponeurosis and muscle were identified superiorly. The ilioinguinal nerve was identified and preserved.  The spermatic cord was isolated and a posterior window was made around it. The direct hernia sac was identified and separated from the spermatic cord using blunt dissection. The hernia sac and its contents were reduced through the direct hernia defect.  The attenuated fascia was reapproximated with a 3-0 Vicryl running suture.   A piece of 3" x 6" polypropylene mesh was brought into the field and anchored 1-2 cm medial to  the pubic tubercle with 2-0 Prolene suture. The inferior aspect of the mesh was anchored to the shelving edge of the inguinal ligament with running 2-0 Prolene suture to a level 1-2 cm lateral to the internal ring. A slit was cut in the mesh creating 2 tails. These were wrapped around the spermatic cord. The superior aspect of the mesh was anchored to the internal oblique aponeurosis and muscle with interrupted 2-0 Vicryl sutures. The 2 tails of the mesh were then crossed creating a new internal ring and were anchored to the shelving edge of the inguinal ligament with 2-0 Prolene suture. The tip of a hemostat could be placed through the new aperture. The lateral aspect of the mesh was then tucked deep to the external oblique aponeurosis.  The wound was inspected and hemostasis was adequate. The external oblique aponeurosis was then closed over the mesh and cord with running 3-0 Vicryl suture. The subcutaneous tissue was closed with running 3-0 Vicryl suture. The skin closed with a running 4-0 Monocryl subcuticular stitch.  Steri-Strips and a sterile dressing were applied.  The procedure was well-tolerated without any apparent complications and the patient was taken to the recovery room in satisfactory condition.  He will restart his Lovenox injections in 24 hours and start his Coumadin in 48 hours.

## 2011-12-23 NOTE — Anesthesia Postprocedure Evaluation (Signed)
  Anesthesia Post-op Note  Patient: Todd Gardner  Procedure(s) Performed: Procedure(s) (LRB): HERNIA REPAIR INGUINAL ADULT (Left) INSERTION OF MESH (Left)  Patient Location: PACU  Anesthesia Type: General  Level of Consciousness: awake and alert   Airway and Oxygen Therapy: Patient Spontanous Breathing  Post-op Pain: mild  Post-op Assessment: Post-op Vital signs reviewed, Patient's Cardiovascular Status Stable, Respiratory Function Stable, Patent Airway and No signs of Nausea or vomiting  Post-op Vital Signs: stable  Complications: No apparent anesthesia complications

## 2011-12-23 NOTE — Transfer of Care (Signed)
Immediate Anesthesia Transfer of Care Note  Patient: Todd Gardner  Procedure(s) Performed: Procedure(s) (LRB) with comments: HERNIA REPAIR INGUINAL ADULT (Left) - Left Inguinal Hernia with Mesh INSERTION OF MESH (Left)  Patient Location: PACU  Anesthesia Type: General  Level of Consciousness: sedated, patient cooperative and responds to stimulation  Airway & Oxygen Therapy: Patient Spontanous Breathing and Patient connected to face mask oxygen  Post-op Assessment: Report given to PACU RN and Post -op Vital signs reviewed and stable  Post vital signs: Reviewed and stable  Complications: No apparent anesthesia complications

## 2011-12-23 NOTE — Interval H&P Note (Signed)
History and Physical Interval Note:  12/23/2011 7:27 AM  Todd Gardner  has presented today for surgery, with the diagnosis of left inguinal hernia  The various methods of treatment have been discussed with the patient and family. After consideration of risks, benefits and other options for treatment, the patient has consented to  Procedure(s) (LRB) with comments: HERNIA REPAIR INGUINAL ADULT (Left) - Left Inguinal Hernia with Mesh INSERTION OF MESH (Left) as a surgical intervention .  The patient's history has been reviewed, patient examined, no change in status, stable for surgery.  I have reviewed the patient's chart and labs.  Questions were answered to the patient's satisfaction.     [name] Shela [place]

## 2011-12-23 NOTE — H&P (View-Only) (Signed)
Patient ID: Todd Gardner, male   DOB: 06/27/1955, 56 y.o.   MRN: [id number]  Chief Complaint  Patient presents with  . Pre-op Exam    reck hernia / schedule sx    HPI Todd Gardner is a 56 y.o. male.   HPI  He is an established patient with a left inguinal hernia. I last saw him in July of 2012. At that time he was having pain from a left inguinal hernia secondary to a cough. Eventually, the cough was able to be treated and resolved. His pain decreased. Now however he has return of the pain was specific movements from the hernia. He still has a swelling present. He is interested in having the hernia repaired.   Past Medical History  Diagnosis Date  . Hernia   . Poor circulation   . Hemorrhoids   . History of blood clots     Pulmonary embolism  . Wears glasses   . Factor V Leiden     positive  . Ulcer     leg  . Pulmonary embolism 1997  . Clotting disorder     Past Surgical History  Procedure Date  . Vericose vein surgery 1997    R leg  . Hand surgery     Left  . Vein ablation surgery 8/12 and 12/08  . Teeth extraction 01-01-2011  3 teeth extracted by oral surgeon    Family History  Problem Relation Age of Onset  . Colon cancer Sister   . Cancer Maternal Grandfather     colon    Social History History  Substance Use Topics  . Smoking status: Never Smoker   . Smokeless tobacco: Never Used  . Alcohol Use: 1.2 oz/week    2 Cans of beer per week    Allergies  Allergen Reactions  . Lidocaine     topical    Current Outpatient Prescriptions  Medication Sig Dispense Refill  . budesonide-formoterol (SYMBICORT) 80-4.5 MCG/ACT inhaler Inhale 2 puffs into the lungs 2 (two) times daily.      . hydrocodone-acetaminophen (LORCET-HD) 5-500 MG per capsule Take 1 capsule by mouth every 6 (six) hours as needed.        . predniSONE (DELTASONE) 20 MG tablet Take 20 mg by mouth daily.      Marland Kitchen warfarin (COUMADIN) 5 MG tablet Take 5 mg by mouth daily.          Review of  Systems Review of Systems  Constitutional: Negative.   Respiratory: Negative.   Cardiovascular: Negative.   Genitourinary: Negative for difficulty urinating.    Blood pressure 150/80, pulse 100, temperature 98.2 F (36.8 C), temperature source Temporal, resp. rate 20, height 6\' 4"  (1.93 m), weight 222 lb 9.6 oz (100.971 kg).  Physical Exam Physical Exam  Constitutional: He appears well-developed and well-nourished. No distress.  HENT:  Head: Normocephalic and atraumatic.  Abdominal: Soft. There is no tenderness.       No umbilical hernia.  Genitourinary:       There is a reducible left inguinal bulge present. There is no right inguinal bulge.    Data Reviewed Old note.  Assessment    Symptomatic left inguinal hernia. He also has factor V Leiden deficiency and is on chronic Coumadin.    Plan    Open left inguinal hernia repair with mesh. I will speak with Dr. Katrinka Blazing regarding his perioperative plan for anticoagulation.  I have explained the procedure, risks, and aftercare of inguinal hernia repair.  Risks include but are not limited to bleeding (higher risk in him), infection, wound problems, anesthesia, recurrence, bladder or intestine injury, urinary retention, testicular dysfunction, chronic pain, mesh problems.  He seems to understand and agrees to proceed.       Layliana Devins J 12/09/2011, 5:43 PM

## 2011-12-24 ENCOUNTER — Encounter (HOSPITAL_COMMUNITY): Payer: Self-pay | Admitting: General Surgery

## 2011-12-25 ENCOUNTER — Telehealth (INDEPENDENT_AMBULATORY_CARE_PROVIDER_SITE_OTHER): Payer: Self-pay | Admitting: General Surgery

## 2011-12-25 NOTE — Telephone Encounter (Signed)
Pt called in explaining that his oxy-acet is only working for about 2 hours.  I informed him that he could also use tylenol in between since he cannot take ibuprofen.  He wanted to also know what he could do to help with swelling.  I informed him that he could do ice and heat compresses to help with this.  He said he was happy to try this and he would call back if he had any other problems.

## 2011-12-25 NOTE — Telephone Encounter (Signed)
Called with a sharp pain in the area of his hernia and noted some blood on the dressing.  He was wondering if he should change this since he was told to leave this for 72 hours.  I recommended that he remove the dressing and he can change the gauze with clean, dry gauze if needed.  If he has to change this more than once or twice or active bleeding, then I recommended that he come to the ER for evaluation.

## 2011-12-31 ENCOUNTER — Ambulatory Visit (INDEPENDENT_AMBULATORY_CARE_PROVIDER_SITE_OTHER): Payer: BC Managed Care – PPO | Admitting: General Surgery

## 2011-12-31 ENCOUNTER — Encounter (INDEPENDENT_AMBULATORY_CARE_PROVIDER_SITE_OTHER): Payer: Self-pay | Admitting: General Surgery

## 2011-12-31 VITALS — BP 140/86 | HR 86 | Resp 18 | Ht 76.0 in | Wt 223.8 lb

## 2011-12-31 DIAGNOSIS — Z9889 Other specified postprocedural states: Secondary | ICD-10-CM

## 2011-12-31 NOTE — Patient Instructions (Signed)
You may use moist heat or ice on the incision which ever makes it feel better.

## 2011-12-31 NOTE — Progress Notes (Signed)
Procedure:  [facility] repair with mesh on 12/24/11  History:  He is here for his first postop visit.  He has some firm swelling and intermittent sharp pains in the incisional area when he moves.  He does not have much of an appetitie.  He has some nausea as well.  He used to take TUMS but stopped after surgery.  He is passing gas and his bowels are moving.  Exam: General- Is in NAD.  Left groin-incision is firm and clean with moderate swelling, repair feels solid  Assessment:  Normal postop course thus far.  Nausea may be related to some silent GERD  Plan:  Restart TUMS.  Ice or moist heat to the incision.  Return visit in 3 weeks.

## 2012-01-07 ENCOUNTER — Encounter (INDEPENDENT_AMBULATORY_CARE_PROVIDER_SITE_OTHER): Payer: BC Managed Care – PPO | Admitting: General Surgery

## 2012-01-22 ENCOUNTER — Encounter (INDEPENDENT_AMBULATORY_CARE_PROVIDER_SITE_OTHER): Payer: Self-pay | Admitting: General Surgery

## 2012-01-22 ENCOUNTER — Ambulatory Visit (INDEPENDENT_AMBULATORY_CARE_PROVIDER_SITE_OTHER): Payer: BC Managed Care – PPO | Admitting: General Surgery

## 2012-01-22 VITALS — BP 172/68 | HR 92 | Temp 97.8°F | Resp 20 | Ht 75.0 in | Wt 229.8 lb

## 2012-01-22 DIAGNOSIS — Z9889 Other specified postprocedural states: Secondary | ICD-10-CM

## 2012-01-22 NOTE — Progress Notes (Signed)
Procedure:  Left Inguinal Hernia repair with mesh  Date:  12/23/11  Pathology:  na  History:  He is here for his second postoperative visit. The pain and discomfort are improving. The swelling is improving. The swelling has been somewhat migratory.  Exam: General- Is in NAD.  Left groin incision is clean, intact, and solid. There is less swelling. The repair is solid.  Assessment:  Progressing well post left inguinal hernia repair.  Plan:  Once the pain and discomfort subsided, he may resume his normal activities as tolerated and we discussed what this means. I also told him that some people  have chronic intermittent pains and he understands this.

## 2012-01-22 NOTE — Patient Instructions (Signed)
When your pain and discomfort has resolved, you may resume your normal activities as we discussed.

## 2012-03-23 DIAGNOSIS — Z0279 Encounter for issue of other medical certificate: Secondary | ICD-10-CM

## 2015-06-29 ENCOUNTER — Ambulatory Visit: Payer: Self-pay | Admitting: Dietician

## 2015-08-07 ENCOUNTER — Encounter: Payer: Self-pay | Admitting: Skilled Nursing Facility1

## 2015-08-07 ENCOUNTER — Encounter: Payer: BLUE CROSS/BLUE SHIELD | Attending: Family Medicine | Admitting: Skilled Nursing Facility1

## 2015-08-07 VITALS — Ht 76.0 in | Wt 225.0 lb

## 2015-08-07 DIAGNOSIS — R7303 Prediabetes: Secondary | ICD-10-CM | POA: Diagnosis not present

## 2015-08-07 NOTE — Progress Notes (Signed)
  Medical Nutrition Therapy:  Appt start time: 0900 end time:  1000.   Assessment:  Primary concerns today: referred for prediabetes. Pt states he works second shift: 2pm-1pm 4 days a week: 8pm eats dinner. Pt states he moved from [place] 9 years ago and states [place] has taken sugar and salt out out of the food supply. Pt states he would like to know how he can eat without eating excess salt and sugar. Pt states too much fiber flares up his hemroids. Pt states he does not drink any water and drinks between 16-20 cups of regular coffee every day (pt was hesitant to divulge this information). Pt states he gets indegestion if he eats right before bed. Pts A1C 6.4 Preferred Learning Style:   No preference indicated   Learning Readiness:   Contemplating  MEDICATIONS: See List   DIETARY INTAKE:  Usual eating pattern includes 3 meals and 2 snacks per day.  Everyday foods include none stated.  Avoided foods include none stated.    24-hr recall:  B ( AM): oatmeal with milk and syrup, grape fruit Snk ( AM): peanut butter crackers L ( PM): chicken, rice, vegetables Snk ( PM): apple D ( PM): sandwich: ham and cheese with chutney with fruit Snk ( PM):  Beverages: coffee, grape fruit juice, apple juice, lemonade  Usual physical activity: ADL's  Estimated energy needs: 2000 calories 225 g carbohydrates 150 g protein 56 g fat  Progress Towards Goal(s):  In progress.   Nutritional Diagnosis:  NB-1.1 Food and nutrition-related knowledge deficit As related to no prior nutrition information from a nutrition professional.  As evidenced by pt report, 24 hr recall.    Intervention:  Nutrition counseling for prediabetes. Dietitian educated the pt on grapefruit and medicine, A1C, carbohydrate counting, balanced meals, excessive caffeine, dehydration, and physical activity.  Teaching Method Utilized:  Visual Auditory Hands on  Handouts given during visit include:  Snack sheet  Barriers  to learning/adherence to lifestyle change: work schedule  Demonstrated degree of understanding via:  Teach Back   Monitoring/Evaluation:  Dietary intake, exercise, A1C, and body weight prn.

## 2015-12-29 ENCOUNTER — Encounter: Payer: Self-pay | Admitting: Vascular Surgery

## 2015-12-29 ENCOUNTER — Other Ambulatory Visit: Payer: Self-pay | Admitting: *Deleted

## 2015-12-29 ENCOUNTER — Ambulatory Visit
Admission: RE | Admit: 2015-12-29 | Discharge: 2015-12-29 | Disposition: A | Discharge status: Home / Self Care | Payer: BLUE CROSS/BLUE SHIELD | Source: Ambulatory Visit | Attending: Family Medicine | Admitting: Family Medicine

## 2015-12-29 ENCOUNTER — Other Ambulatory Visit: Payer: Self-pay | Admitting: Family Medicine

## 2015-12-29 DIAGNOSIS — R059 Cough, unspecified: Secondary | ICD-10-CM

## 2015-12-29 DIAGNOSIS — R05 Cough: Secondary | ICD-10-CM

## 2015-12-29 DIAGNOSIS — I83009 Varicose veins of unspecified lower extremity with ulcer of unspecified site: Secondary | ICD-10-CM

## 2015-12-29 DIAGNOSIS — L97909 Non-pressure chronic ulcer of unspecified part of unspecified lower leg with unspecified severity: Principal | ICD-10-CM

## 2016-01-01 ENCOUNTER — Ambulatory Visit (HOSPITAL_COMMUNITY)
Admission: RE | Admit: 2016-01-01 | Discharge: 2016-01-01 | Disposition: A | Discharge status: Home / Self Care | Payer: BLUE CROSS/BLUE SHIELD | Source: Ambulatory Visit | Attending: Vascular Surgery | Admitting: Vascular Surgery

## 2016-01-01 DIAGNOSIS — L97909 Non-pressure chronic ulcer of unspecified part of unspecified lower leg with unspecified severity: Secondary | ICD-10-CM

## 2016-01-01 DIAGNOSIS — I878 Other specified disorders of veins: Secondary | ICD-10-CM | POA: Diagnosis not present

## 2016-01-01 DIAGNOSIS — I83009 Varicose veins of unspecified lower extremity with ulcer of unspecified site: Secondary | ICD-10-CM

## 2016-01-02 ENCOUNTER — Encounter: Payer: Self-pay | Admitting: Vascular Surgery

## 2016-01-02 ENCOUNTER — Ambulatory Visit (INDEPENDENT_AMBULATORY_CARE_PROVIDER_SITE_OTHER): Payer: BLUE CROSS/BLUE SHIELD | Admitting: Vascular Surgery

## 2016-01-02 VITALS — BP 132/80 | HR 92 | Temp 98.3°F | Resp 18 | Ht 75.0 in | Wt 218.0 lb

## 2016-01-02 DIAGNOSIS — IMO0001 Reserved for inherently not codable concepts without codable children: Secondary | ICD-10-CM | POA: Insufficient documentation

## 2016-01-02 DIAGNOSIS — I87311 Chronic venous hypertension (idiopathic) with ulcer of right lower extremity: Secondary | ICD-10-CM

## 2016-01-02 DIAGNOSIS — L97919 Non-pressure chronic ulcer of unspecified part of right lower leg with unspecified severity: Principal | ICD-10-CM

## 2016-01-02 DIAGNOSIS — I83891 Varicose veins of right lower extremities with other complications: Secondary | ICD-10-CM | POA: Diagnosis not present

## 2016-01-02 NOTE — Progress Notes (Signed)
Subjective:     Patient ID: Todd Gardner, male   DOB: 12-18-1955, 60 y.o.   MRN: [id number]  HPI This 60 year old male is known to me having been previously treated with laser ablation 6 years ago. He had multiple painful varicosities which were also treated. He required an attempt at re-closure in 2012 because there was partial recanalization of the great saphenous and anterior accessory branch of the great saphenous of the right leg. Otherwise the vein was mostly chronically occluded. This was unsuccessful and he had multiple stab phlebectomy-approximately 30 of painful varicosities and he has done well for the past 5 years until the past several months. He now has developed recurrent ulceration in the right ankle which has had spontaneous bleeding. He has severe pain in the right medial malleolar area where the ulcer is located. He has been wearing long leg elastic compression stockings 20-30 millimeter gradient and tried elevation without success. He has no symptoms and contralateral left leg.  Past Medical History:  Diagnosis Date  . Clotting disorder (HCC)   . Factor V Leiden (HCC)    positive  . Hemorrhoids   . Hernia   . History of blood clots    Pulmonary embolism  . Jock itch 12-20-11   mild case at present  . Poor circulation   . Pulmonary embolism (HCC) 1997  . Ulcer (HCC)    leg  . Wears glasses     Social History  Substance Use Topics  . Smoking status: Never Smoker  . Smokeless tobacco: Never Used  . Alcohol use 1.2 oz/week    2 Cans of beer per week     Comment: 3 dks per week    Family History  Problem Relation Age of Onset  . Colon cancer Sister   . Cancer Maternal Grandfather     colon    Allergies  Allergen Reactions  . Lidocaine     topical     Current Outpatient Prescriptions:  .  warfarin (COUMADIN) 5 MG tablet, Take 5 mg by mouth daily.  , Disp: , Rfl:  .  Acetaminophen (TYLENOL 8 HOUR PO), Take by mouth., Disp: , Rfl:  .  budesonide-formoterol  (SYMBICORT) 80-4.5 MCG/ACT inhaler, Inhale 2 puffs into the lungs 2 (two) times daily. Pt. Uses this as needed now and not daily. Not used in several months-none on hand., Disp: , Rfl:  .  hydrocodone-acetaminophen (LORCET-HD) 5-500 MG per capsule, Take 1 capsule by mouth every 6 (six) hours as needed. For pain, Disp: , Rfl:  .  Polyethyl Glycol-Propyl Glycol (SYSTANE ULTRA) 0.4-0.3 % SOLN, Place 1 drop into both eyes daily., Disp: , Rfl:   Vitals:   01/02/16 1130  BP: 132/80  Pulse: 92  Resp: 18  Temp: 98.3 F (36.8 C)  SpO2: 97%  Weight: 218 lb (98.9 kg)  Height: 6\' 3"  (1.905 m)    Body mass index is 27.25 kg/m.         Review of Systems Eyes chest pain, dyspnea on exertion, PND, orthopnea. Patient does have a history of factor V Leiden deficiency. Is on chronic Coumadin therapy.    Objective:   Physical Exam BP 132/80 (BP Location: Left Arm, Patient Position: Sitting, Cuff Size: Large)   Pulse 92   Temp 98.3 F (36.8 C)   Resp 18   Ht 6\' 3"  (1.905 m)   Wt 218 lb (98.9 kg)   SpO2 97%   BMI 27.25 kg/m     Gen.-alert and  oriented x3 in no apparent distress HEENT normal for age Lungs no rhonchi or wheezing Cardiovascular regular rhythm no murmurs carotid pulses 3+ palpable no bruits audible Abdomen soft nontender no palpable masses Musculoskeletal free of  major deformities Skin clear -no rashes Neurologic normal Lower extremities 3+ femoral and dorsalis pedis pulses palpable bilaterally with no edema on the left 1+ edema on the right Extensive bulging varicosities medial right calf down the medial malleolus. Severe hyperpigmentation lower third right leg with active ulceration posterior to right medial malleolus. 3+ dorsalis pedis pulse palpable.  Patient had repeat venous duplex exam in our lab yesterday which reveals total recanalization of the great saphenous vein from the midcalf to the saphenofemoral junction which is quite large in caliber as well as an  enlarged right small saphenous vein with reflux.  Today I performed a bedside SonoSite ultrasound exam which confirmed the above findings. There is some tortuosity of the right great saphenous vein but it is a large caliber vein with lot of reverse flow supplying these painful varicosities       Assessment:     Painful varicosities right leg with recurrent stasis ulcer with bleeding right ankle due to recanalization of remote lead treated right great saphenous vein. Vein has gross reflux and is large caliber History of factor V Leiden deficiency-on chronic Coumadin therapy    Plan:     Patient needs laser ablation right great saphenous vein from mid calf to saphenofemoral junction plus multiple stab phlebectomy of painful varicosities-10-20. We will proceed with precertification to perform this in the near future He has been wearing elastic compression stockings 20-30 millimeter gradient and also has had recent spontaneous bleeding which would assess a take proceeding at this time with his treatment  Will need to stop Coumadin therapy 5 days before the ablation procedure and resume it 5 days following the procedure

## 2016-01-31 ENCOUNTER — Encounter: Payer: Self-pay | Admitting: Vascular Surgery

## 2016-02-13 ENCOUNTER — Encounter: Payer: Self-pay | Admitting: Vascular Surgery

## 2016-02-13 ENCOUNTER — Ambulatory Visit (INDEPENDENT_AMBULATORY_CARE_PROVIDER_SITE_OTHER): Payer: BLUE CROSS/BLUE SHIELD | Admitting: Vascular Surgery

## 2016-02-13 VITALS — BP 140/88 | HR 90 | Temp 97.6°F | Resp 18 | Ht 75.0 in | Wt 220.0 lb

## 2016-02-13 DIAGNOSIS — I83891 Varicose veins of right lower extremities with other complications: Secondary | ICD-10-CM | POA: Diagnosis not present

## 2016-02-13 NOTE — Progress Notes (Signed)
Laser Ablation Procedure    Date: 02/13/2016   Todd Gardner DOB:April 16, 1955  Consent signed: Yes    Surgeon:  Dr. Quita SkyeJames D. Hart [name]  Procedure: Laser Ablation: right Greater Saphenous Vein  BP 140/88   Pulse 90   Temp 97.6 F (36.4 C)   Resp 18   Ht 6\' 3"  (1.905 m)   Wt 220 lb (99.8 kg)   SpO2 100%   BMI 27.50 kg/m   Tumescent Anesthesia: 425 cc 0.9% NaCl with 50 cc Lidocaine HCL with 1% Epi and 15 cc 8.4% NaHCO3  Local Anesthesia: 8 cc Lidocaine HCL and NaHCO3 (ratio 2:1)  Pulsed Mode: 17 watts, 500ms delay, 1.0 duration  Total Energy:2910              Total Pulses: 172               Total Time: 2:51   Stab Phlebectomy: 10-20 Sites: Thigh and Calf  Patient tolerated procedure well  Notes:   Description of Procedure:  After marking the course of the secondary varicosities, the patient was placed on the operating table in the supine position, and the right leg was prepped and draped in sterile fashion.   Local anesthetic was administered and under ultrasound guidance the saphenous vein was accessed with a micro needle and guide wire; then the mirco puncture sheath was placed.  A guide wire was inserted saphenofemoral junction , followed by a 5 french sheath.  The position of the sheath and then the laser fiber below the junction was confirmed using the ultrasound.  Tumescent anesthesia was administered along the course of the saphenous vein using ultrasound guidance. The patient was placed in Trendelenburg position and protective laser glasses were placed on patient and staff, and the laser was fired at 15 watts continuous mode advancing 1-252mm/second for a total of 2910 joules.   For stab phlebectomies, local anesthetic was administered at the previously marked varicosities, and tumescent anesthesia was administered around the vessels.  Ten to 20 stab wounds were made using the tip of an 11 blade. And using the vein hook, the phlebectomies were performed using a hemostat to avulse  the varicosities.  Adequate hemostasis was achieved.     Steri strips were applied to the stab wounds and ABD pads and thigh high compression stockings were applied.  Ace wrap bandages were applied over the phlebectomy sites and at the top of the saphenofemoral junction. Blood loss was less than 15 cc.  The patient ambulated out of the operating room having tolerated the procedure well.

## 2016-02-13 NOTE — Progress Notes (Signed)
Subjective:     Patient ID: Todd Gardner, male   DOB: [birth year], 60 y.o.   MRN: [id number]  HPI This 60 year old male had laser ablation of the right great saphenous vein +10-20 stab phlebectomy of painful varicosities performed under local tumescent anesthesia. A total of 2910 J of energy was utilized. There was a large medial branch arising from the saphenofemoral junction which was not treated and also had gross reflux. The patient tolerated the procedure well.  Review of Systems     Objective:   Physical Exam BP 140/88   Pulse 90   Temp 97.6 F (36.4 C)   Resp 18   Ht 6\' 3"  (1.905 m)   Wt 220 lb (99.8 kg)   SpO2 100%   BMI 27.50 kg/m       Assessment:     Well-tolerated laser ablation right great saphenous vein from proximal calf to near the saphenofemoral junction +10-20 stab phlebectomy of painful varicosities performed under local tumescent anesthesia    Plan:     Return in 1 week for venous duplex exam to confirm closure right great saphenous vein. Patient has had recanalization in the past from laser ablation. Higher energy was utilized during this technique. Patient will resume Coumadin regular dose tomorrow for his factor V Leiden deficiency

## 2016-02-14 ENCOUNTER — Encounter: Payer: Self-pay | Admitting: Vascular Surgery

## 2016-02-14 ENCOUNTER — Ambulatory Visit: Payer: BLUE CROSS/BLUE SHIELD | Admitting: *Deleted

## 2016-02-14 DIAGNOSIS — I83891 Varicose veins of right lower extremities with other complications: Secondary | ICD-10-CM

## 2016-02-14 NOTE — Progress Notes (Signed)
Ace wraps were causing pain. Had him come in so I could re-wrap. Leg looks good with minimal bleeding and usual amt of bruising at the top of the thigh. Follow prn.

## 2016-02-19 ENCOUNTER — Other Ambulatory Visit: Payer: Self-pay | Admitting: *Deleted

## 2016-02-19 DIAGNOSIS — I83811 Varicose veins of right lower extremities with pain: Secondary | ICD-10-CM

## 2016-02-20 ENCOUNTER — Ambulatory Visit (HOSPITAL_COMMUNITY)
Admission: RE | Admit: 2016-02-20 | Discharge: 2016-02-20 | Disposition: A | Discharge status: Home / Self Care | Payer: BLUE CROSS/BLUE SHIELD | Source: Ambulatory Visit | Attending: Vascular Surgery | Admitting: Vascular Surgery

## 2016-02-20 ENCOUNTER — Ambulatory Visit (INDEPENDENT_AMBULATORY_CARE_PROVIDER_SITE_OTHER): Payer: Self-pay | Admitting: Vascular Surgery

## 2016-02-20 ENCOUNTER — Encounter: Payer: Self-pay | Admitting: Vascular Surgery

## 2016-02-20 VITALS — BP 134/83 | HR 93 | Temp 98.3°F | Resp 18 | Ht 75.0 in | Wt 225.0 lb

## 2016-02-20 DIAGNOSIS — I83811 Varicose veins of right lower extremities with pain: Secondary | ICD-10-CM

## 2016-02-20 DIAGNOSIS — I82491 Acute embolism and thrombosis of other specified deep vein of right lower extremity: Secondary | ICD-10-CM | POA: Diagnosis not present

## 2016-02-20 DIAGNOSIS — I83891 Varicose veins of right lower extremities with other complications: Secondary | ICD-10-CM

## 2016-02-20 DIAGNOSIS — I868 Varicose veins of other specified sites: Secondary | ICD-10-CM

## 2016-02-20 NOTE — Progress Notes (Signed)
Subjective:     Patient ID: Todd Gardner, male   DOB: 1955/11/12, 60 y.o.   MRN: [id number]  HPI This 60 year old male returns 1 week post-laser ablation right great saphenous vein from the proximal calf to near the saphenofemoral junction. He also had 10-20 stab phlebectomy painful varicosities. He has had a moderate amount of pain in the proximal thigh up to the groin area. He also has had significant bruising. He is on chronic Coumadin which she has restarted. He was unable to take ibuprofen because of the Coumadin and has been taking Tylenol sporadically. He got some different compression stockings because the 20/30 stockings were to tighten is currently wearing long leg 15-20 millimeter gradient stockings. He denies any chest pain dyspnea on exertion or hemoptysis.  Review of Systems     Objective:   Physical Exam BP 134/83 (BP Location: Left Arm, Patient Position: Sitting, Cuff Size: Normal)   Pulse 93   Temp 98.3 F (36.8 C)   Resp 18   Ht 6\' 3"  (1.905 m)   Wt 225 lb (102.1 kg)   SpO2 95%   BMI 28.12 kg/m   Gen. well-developed well-nourished male no apparent distress alert and oriented 3 Lungs no rhonchi or wheezing Right leg with mild to moderate ecchymosis in mid to proximal thigh extending posteriorly. No hematoma palpable. Mild tenderness to deep palpation over the great saphenous vein. Stab phlebectomy sites and calf are healing nicely. 3+ dorsalis pedis pulse palpable. 1+ chronic edema with severe hyperpigmentation lower third right leg.  Today I ordered a venous duplex exam the right leg which I reviewed and interpreted. There is no DVT. There is total closure of the great saphenous vein up to the saphenofemoral junction but no thrombus is extending into the femoral vein.  Also performed a bedside SonoSite ultrasound exam which confirms the above findings. Otha medial branch of the great saphenous vein and the main branch of the great saphenous vein are totally occluded up  to the junction.     Assessment:     Successful laser ablation right great saphenous vein plus multiple stab phlebectomy of painful varicosities using higher energy than previously in the past     Plan:     Patient will wear long-leg stockings for 1 more week and then convert to short leg compression stockings on a chronic basis which he has been doing Return to see me on a when necessary basis

## 2016-05-24 ENCOUNTER — Other Ambulatory Visit: Payer: Self-pay | Admitting: Family Medicine

## 2016-05-24 ENCOUNTER — Ambulatory Visit
Admission: RE | Admit: 2016-05-24 | Discharge: 2016-05-24 | Disposition: A | Discharge status: Home / Self Care | Payer: BLUE CROSS/BLUE SHIELD | Source: Ambulatory Visit | Attending: Family Medicine | Admitting: Family Medicine

## 2016-05-24 DIAGNOSIS — M25511 Pain in right shoulder: Secondary | ICD-10-CM

## 2016-07-12 ENCOUNTER — Other Ambulatory Visit: Payer: Self-pay | Admitting: Family Medicine

## 2016-07-12 ENCOUNTER — Ambulatory Visit
Admission: RE | Admit: 2016-07-12 | Discharge: 2016-07-12 | Disposition: A | Discharge status: Home / Self Care | Payer: BLUE CROSS/BLUE SHIELD | Source: Ambulatory Visit | Attending: Family Medicine | Admitting: Family Medicine

## 2016-07-12 DIAGNOSIS — R053 Chronic cough: Secondary | ICD-10-CM

## 2016-07-12 DIAGNOSIS — R05 Cough: Secondary | ICD-10-CM

## 2017-08-05 ENCOUNTER — Other Ambulatory Visit: Payer: Self-pay

## 2017-08-05 DIAGNOSIS — I83811 Varicose veins of right lower extremities with pain: Secondary | ICD-10-CM

## 2017-08-13 ENCOUNTER — Inpatient Hospital Stay (HOSPITAL_COMMUNITY): Admission: RE | Admit: 2017-08-13 | Payer: BLUE CROSS/BLUE SHIELD | Source: Ambulatory Visit

## 2017-08-18 ENCOUNTER — Ambulatory Visit: Payer: No Typology Code available for payment source | Admitting: Podiatry

## 2017-08-18 ENCOUNTER — Encounter: Payer: Self-pay | Admitting: Podiatry

## 2017-08-18 VITALS — BP 129/78 | HR 72 | Resp 16

## 2017-08-18 DIAGNOSIS — B070 Plantar wart: Secondary | ICD-10-CM

## 2017-08-18 DIAGNOSIS — Z800 Family history of malignant neoplasm of digestive organs: Secondary | ICD-10-CM | POA: Insufficient documentation

## 2017-08-18 DIAGNOSIS — Z8719 Personal history of other diseases of the digestive system: Secondary | ICD-10-CM | POA: Insufficient documentation

## 2017-08-18 DIAGNOSIS — Z7901 Long term (current) use of anticoagulants: Secondary | ICD-10-CM | POA: Insufficient documentation

## 2017-08-18 DIAGNOSIS — I872 Venous insufficiency (chronic) (peripheral): Secondary | ICD-10-CM | POA: Insufficient documentation

## 2017-08-18 DIAGNOSIS — Z86711 Personal history of pulmonary embolism: Secondary | ICD-10-CM | POA: Insufficient documentation

## 2017-08-18 DIAGNOSIS — J309 Allergic rhinitis, unspecified: Secondary | ICD-10-CM | POA: Insufficient documentation

## 2017-08-18 DIAGNOSIS — E785 Hyperlipidemia, unspecified: Secondary | ICD-10-CM | POA: Insufficient documentation

## 2017-08-21 NOTE — Progress Notes (Signed)
   Subjective: 62 year old male presenting today as a new patient with a chief complaint of tenderness to the plantar aspect of the right foot that began a few months ago. He notes two callused lesions to the area. Walking increases the pain. He has not done anything for treatment. Patient is here for further evaluation and treatment.   Past Medical History:  Diagnosis Date  . Clotting disorder (HCC)   . Factor V Leiden (HCC)    positive  . Hemorrhoids   . Hernia   . History of blood clots    Pulmonary embolism  . Jock itch 12-20-11   mild case at present  . Poor circulation   . Pulmonary embolism (HCC) 1997  . Ulcer    leg  . Wears glasses     Objective: Physical Exam General: The patient is alert and oriented x3 in no acute distress.  Dermatology: Hyperkeratotic skin lesions noted to the plantar aspect of the right foot approximately 1 cm in diameter. Pinpoint bleeding noted upon debridement. Skin is warm, dry and supple bilateral lower extremities. Negative for open lesions or macerations.  Vascular: Palpable pedal pulses bilaterally. No edema or erythema noted. Capillary refill within normal limits.  Neurological: Epicritic and protective threshold grossly intact bilaterally.   Musculoskeletal Exam: Pain on palpation to the note skin lesion.  Range of motion within normal limits to all pedal and ankle joints bilateral. Muscle strength 5/5 in all groups bilateral.   Assessment: #1 plantar wart right foot x 3 #2 pain in right foot   Plan of Care:  #1 Patient was evaluated. #2 Excisional debridement of the plantar wart lesion was performed using a chisel blade. Cantharone was applied and the lesion was dressed with a dry sterile dressing. #3 patient is to return to clinic in 2 weeks.  Librarian at [employer]  Felecia ShellingBrent M. Linkyn Gobin, [place] Dakota[place]PM [facility]  Dr. Felecia ShellingBrent M. Matilde Markie, DPM    [address]                                          [place], [place] [address]                Office [phone]  Fax [phone]

## 2017-08-28 ENCOUNTER — Ambulatory Visit (HOSPITAL_COMMUNITY)
Admission: RE | Admit: 2017-08-28 | Discharge: 2017-08-28 | Disposition: A | Discharge status: Home / Self Care | Payer: No Typology Code available for payment source | Source: Ambulatory Visit | Attending: Vascular Surgery | Admitting: Vascular Surgery

## 2017-08-28 DIAGNOSIS — I872 Venous insufficiency (chronic) (peripheral): Secondary | ICD-10-CM | POA: Insufficient documentation

## 2017-08-28 DIAGNOSIS — I83811 Varicose veins of right lower extremities with pain: Secondary | ICD-10-CM | POA: Insufficient documentation

## 2017-09-01 ENCOUNTER — Ambulatory Visit: Payer: No Typology Code available for payment source | Admitting: Podiatry

## 2017-09-01 ENCOUNTER — Encounter: Payer: Self-pay | Admitting: Podiatry

## 2017-09-01 DIAGNOSIS — B070 Plantar wart: Secondary | ICD-10-CM | POA: Diagnosis not present

## 2017-09-03 ENCOUNTER — Ambulatory Visit (INDEPENDENT_AMBULATORY_CARE_PROVIDER_SITE_OTHER): Payer: No Typology Code available for payment source | Admitting: Vascular Surgery

## 2017-09-03 ENCOUNTER — Encounter: Payer: Self-pay | Admitting: Vascular Surgery

## 2017-09-03 VITALS — BP 160/104 | HR 48 | Temp 98.4°F | Resp 16 | Ht 75.0 in | Wt 216.0 lb

## 2017-09-03 DIAGNOSIS — I83811 Varicose veins of right lower extremities with pain: Secondary | ICD-10-CM | POA: Diagnosis not present

## 2017-09-03 NOTE — Progress Notes (Signed)
Patient name: Todd Gardner MRN: [id number] DOB: 1955/07/20 Sex: male   REASON FOR CONSULT:    Varicose veins right lower extremity with swelling.  The consult is requested by Dr. Tally Joe.  HPI:   Todd Gardner is a pleasant 62 y.o. male, who presents with painful varicose veins of the right lower extremity.  He had laser ablation in the right leg in 2009 for an ulcer that healed after the procedure.  He suddenly had endovenous laser ablation of the right great saphenous vein in 2017 with 20-30 stab phlebectomies by Dr. [name].  Since that time he is developed some recurrent large varicose veins in the medial aspect of his right thigh extending onto the right leg.  He experiences pain mostly when he is kneeling.  He does elevate his legs some which helps some.  He wears compression stockings which are knee-high 18 to 24 mmHg.  He does not take ibuprofen because he is on Coumadin.  He is on Coumadin because he has a history of a pulmonary embolus.  He does have a factor V Leiden hypercoagulable condition.  His symptoms have been gradually progressive over the last 9 months.  He said no bleeding problems.  Past Medical History:  Diagnosis Date  . Clotting disorder (HCC)   . Factor V Leiden (HCC)    positive  . Hemorrhoids   . Hernia   . History of blood clots    Pulmonary embolism  . Jock itch 12-20-11   mild case at present  . Poor circulation   . Pulmonary embolism (HCC) 1997  . Ulcer    leg  . Wears glasses     Family History  Problem Relation Age of Onset  . Colon cancer Sister   . Cancer Maternal Grandfather        colon    SOCIAL HISTORY: Social History   Socioeconomic History  . Marital status: Married    Spouse name: Not on file  . Number of children: Y  . Years of education: Not on file  . Highest education level: Not on file  Occupational History  . Occupation: Firefighter: [employer]  Social Needs  . Financial  resource strain: Not on file  . Food insecurity:    Worry: Not on file    Inability: Not on file  . Transportation needs:    Medical: Not on file    Non-medical: Not on file  Tobacco Use  . Smoking status: Never Smoker  . Smokeless tobacco: Never Used  Substance and Sexual Activity  . Alcohol use: Yes    Alcohol/week: 1.2 oz    Types: 2 Cans of beer per week    Comment: 3 dks per week  . Drug use: No  . Sexual activity: Yes  Lifestyle  . Physical activity:    Days per week: Not on file    Minutes per session: Not on file  . Stress: Not on file  Relationships  . Social connections:    Talks on phone: Not on file    Gets together: Not on file    Attends religious service: Not on file    Active member of club or organization: Not on file    Attends meetings of clubs or organizations: Not on file    Relationship status: Not on file  . Intimate partner violence:    Fear of current or ex partner: Not on file  Emotionally abused: Not on file    Physically abused: Not on file    Forced sexual activity: Not on file  Other Topics Concern  . Not on file  Social History Narrative   Moved here from [place].    Allergies  Allergen Reactions  . Lidocaine     topical    Current Outpatient Medications  Medication Sig Dispense Refill  . warfarin (COUMADIN) 5 MG tablet Take 5 mg by mouth daily.       No current facility-administered medications for this visit.     REVIEW OF SYSTEMS:  [X]  denotes positive finding, [ ]  denotes negative finding Cardiac  Comments:  Chest pain or chest pressure:    Shortness of breath upon exertion:    Short of breath when lying flat:    Irregular heart rhythm:        Vascular    Pain in calf, thigh, or hip brought on by ambulation:    Pain in feet at night that wakes you up from your sleep:     Blood clot in your veins:    Leg swelling:         Pulmonary    Oxygen at home:    Productive cough:     Wheezing:         Neurologic      Sudden weakness in arms or legs:     Sudden numbness in arms or legs:     Sudden onset of difficulty speaking or slurred speech:    Temporary loss of vision in one eye:     Problems with dizziness:         Gastrointestinal    Blood in stool:     Vomited blood:         Genitourinary    Burning when urinating:     Blood in urine:        Psychiatric    Major depression:         Hematologic    Bleeding problems:    Problems with blood clotting too easily:        Skin    Rashes or ulcers:        Constitutional    Fever or chills:     PHYSICAL EXAM:   There were no vitals filed for this visit.  GENERAL: The patient is a well-nourished male, in no acute distress. The vital signs are documented above. CARDIAC: There is a regular rate and rhythm.  VASCULAR: I do not detect carotid bruits. He has palpable femoral, popliteal, and pedal pulses bilaterally. He has no significant lower extremity swelling. He has hyperpigmentation of the right leg with an area of 2 healed venous ulcers. He has large varicose veins along the medial and anterior aspect of his right thigh extending onto the right leg. PULMONARY: There is good air exchange bilaterally without wheezing or rales. ABDOMEN: Soft and non-tender with normal pitched bowel sounds.  MUSCULOSKELETAL: There are no major deformities or cyanosis. NEUROLOGIC: No focal weakness or paresthesias are detected. SKIN: There are no ulcers or rashes noted. PSYCHIATRIC: The patient has a normal affect.  DATA:    VENOUS DUPLEX: I reviewed his venous duplex scan.  This shows no evidence of DVT or superficial thrombus phlebitis in the right leg.  He does have deep venous reflux on the right involving the common femoral vein.  He also has reflux and what appears to be an anterior accessory right great saphenous vein.  The diameters of  the vein range from 0.66-0.72 cm.  I interrogated the vein myself in the office.  He appears to have reflux in  an anterior accessory saphenous vein which joins this large cluster varicose veins.  MEDICAL ISSUES:   CHRONIC VENOUS INSUFFICIENCY: This patient has developed recurrent varicose veins secondary to an incompetent right accessory anterior saphenous vein.  We have discussed the importance of intermittent leg elevation and the proper positioning for this.  I encouraged him to try thigh-high stockings with a gradient of 20 to 30 mmHg.  I encouraged him to avoid prolonged sitting and standing and to exercise.  I will plan on seeing him back in 3 to 6 months.  If his symptoms progress I think the options would be stab phlebectomy of his large varicose veins versus a more aggressive approach which would likely involve high ligation in the operating room of the anterior  accessory right great saphenous vein.  Based on my duplex exam today I did not see an area that would be easy to laser as the proximal segment was fairly short and it was difficult to define where this entered into the deep system.  I will see him back in 6 months.  He knows to call sooner if he has problems.  Waverly Ferrarihristopher Adley Castello [facility] [phone]

## 2017-09-08 NOTE — Progress Notes (Signed)
   Subjective: 62 year old male presenting today with a chief complaint of warts to the plantar aspects of bilateral feet. He states he is currently not experiencing any pain and there are no modifying factors noted. He has not done anything for treatment since his previous visit. Patient is here for further evaluation and treatment.    Past Medical History:  Diagnosis Date  . Clotting disorder (HCC)   . Factor V Leiden (HCC)    positive  . Hemorrhoids   . Hernia   . History of blood clots    Pulmonary embolism  . Jock itch 12-20-11   mild case at present  . Poor circulation   . Pulmonary embolism (HCC) 1997  . Ulcer    leg  . Wears glasses     Objective: Physical Exam General: The patient is alert and oriented x3 in no acute distress.   Dermatology: Hyperkeratotic skin lesions noted to the plantar aspect of the bilateral feet approximately 1 cm in diameter. Pinpoint bleeding noted upon debridement. Skin is warm, dry and supple bilateral lower extremities. Negative for open lesions or macerations.   Vascular: Palpable pedal pulses bilaterally. No edema or erythema noted. Capillary refill within normal limits.   Neurological: Epicritic and protective threshold grossly intact bilaterally.    Musculoskeletal Exam: Pain on palpation to the note skin lesion.  Range of motion within normal limits to all pedal and ankle joints bilateral. Muscle strength 5/5 in all groups bilateral.    Assessment: - plantar wart bilateral feet x 4    Plan of Care:  - Patient was evaluated. - Excisional debridement of the plantar wart lesion(s) was performed using a chisel blade. Cantharone was applied to the two lesions of the left foot and was dressed with a dry sterile dressing. - Excisional debridement of the plantar wart lesion(s) was performed using a chisel blade. Salinocaine was applied to the two lesions of the right foot and was dressed with a dry sterile dressing.  - patient is to return  to clinic in 2 weeks  Felecia ShellingBrent M. Rondal Vandevelde, DPM [facility]  Dr. Felecia ShellingBrent M. Cora Stetson, DPM    [address]                                        [place], [place] [address]                Office [phone]  Fax [phone]

## 2017-09-17 ENCOUNTER — Ambulatory Visit: Payer: BLUE CROSS/BLUE SHIELD | Admitting: Skilled Nursing Facility1

## 2017-09-17 ENCOUNTER — Ambulatory Visit: Payer: No Typology Code available for payment source | Admitting: Podiatry

## 2017-09-17 DIAGNOSIS — B070 Plantar wart: Secondary | ICD-10-CM

## 2017-09-24 NOTE — Progress Notes (Signed)
   HPI: 62 year old male presenting today for follow up evaluation of plantar warts of the bilateral feet. He states the areas have healed well and are now peeling. There are no modifying factors noted. He denies any new complaints or any pain at this time. Patient is here for further evaluation and treatment.   Past Medical History:  Diagnosis Date  . Clotting disorder (HCC)   . Factor V Leiden (HCC)    positive  . Hemorrhoids   . Hernia   . History of blood clots    Pulmonary embolism  . Jock itch 12-20-11   mild case at present  . Poor circulation   . Pulmonary embolism (HCC) 1997  . Ulcer    leg  . Wears glasses      Physical Exam: General: The patient is alert and oriented x3 in no acute distress.  Dermatology: Skin is warm, dry and supple bilateral lower extremities. Negative for open lesions or macerations.  Vascular: Palpable pedal pulses bilaterally. No edema or erythema noted. Capillary refill within normal limits.  Neurological: Epicritic and protective threshold grossly intact bilaterally.   Musculoskeletal Exam: Range of motion within normal limits to all pedal and ankle joints bilateral. Muscle strength 5/5 in all groups bilateral.   Assessment: 1. Plantar warts bilateral feet x 4 - resolved    Plan of Care:  1. Patient evaluated. 2. Light debridement performed using a tissue nipper.  3. Recommended good shoe gear.  4. Return to clinic as needed.     Felecia ShellingBrent M. Kendle Turbin, DPM [facility]  Dr. Felecia ShellingBrent M. Ramina Hulet, DPM    [address]                                        [place], [place] [address]                Office [phone]  Fax [phone]

## 2017-10-22 ENCOUNTER — Other Ambulatory Visit: Payer: Self-pay | Admitting: General Surgery

## 2017-10-22 DIAGNOSIS — R1032 Left lower quadrant pain: Secondary | ICD-10-CM

## 2017-11-05 ENCOUNTER — Ambulatory Visit
Admission: RE | Admit: 2017-11-05 | Discharge: 2017-11-05 | Disposition: A | Discharge status: Home / Self Care | Payer: No Typology Code available for payment source | Source: Ambulatory Visit | Attending: General Surgery | Admitting: General Surgery

## 2017-11-05 DIAGNOSIS — R1032 Left lower quadrant pain: Secondary | ICD-10-CM

## 2018-01-13 ENCOUNTER — Encounter: Payer: Self-pay | Admitting: Hematology and Oncology

## 2018-01-13 ENCOUNTER — Telehealth: Payer: Self-pay | Admitting: Hematology and Oncology

## 2018-01-13 NOTE — Telephone Encounter (Signed)
New referral from Dr. Azucena Cecil for activated C protein. Pt has been cld and scheduled to see Dr. Caron Presume on 11/8 at 11am. Letter mailed.

## 2018-01-23 ENCOUNTER — Encounter: Payer: Self-pay | Admitting: Hematology and Oncology

## 2018-01-23 ENCOUNTER — Inpatient Hospital Stay
Payer: No Typology Code available for payment source | Attending: Hematology and Oncology | Admitting: Hematology and Oncology

## 2018-01-23 VITALS — BP 134/66 | HR 52 | Temp 98.2°F | Resp 18 | Ht 75.0 in | Wt 221.2 lb

## 2018-01-23 DIAGNOSIS — Z86711 Personal history of pulmonary embolism: Secondary | ICD-10-CM | POA: Diagnosis not present

## 2018-01-23 DIAGNOSIS — Z79899 Other long term (current) drug therapy: Secondary | ICD-10-CM | POA: Diagnosis not present

## 2018-01-23 DIAGNOSIS — Z7901 Long term (current) use of anticoagulants: Secondary | ICD-10-CM | POA: Diagnosis not present

## 2018-01-23 DIAGNOSIS — Z86718 Personal history of other venous thrombosis and embolism: Secondary | ICD-10-CM | POA: Diagnosis not present

## 2018-01-23 DIAGNOSIS — Z800 Family history of malignant neoplasm of digestive organs: Secondary | ICD-10-CM | POA: Insufficient documentation

## 2018-01-23 DIAGNOSIS — D6851 Activated protein C resistance: Secondary | ICD-10-CM | POA: Diagnosis not present

## 2018-01-23 DIAGNOSIS — I2699 Other pulmonary embolism without acute cor pulmonale: Secondary | ICD-10-CM

## 2018-01-23 NOTE — Patient Instructions (Addendum)
We discussed in detail your prior history with thromboembolic disease (pulmonary emboli).  It is feasible both clinically and technically to switch from warfarin to one of the newer agents such as Eliquis.  Those detailed instructions I will be forwarded to Dr. Azucena Cecil.  Because of your heart murmur, I would consider strongly getting an EKG and checkup with Dr. Alonna Buckler before making the transition.  We discussed also the possibility of a complete hypercoagulable evaluation to identify any additional gene mutations or factor deficiencies that may predispose you to clot formation.  Because it is unlikely that you will stop anticoagulation given your history, the benefit of that extensive evaluation is marginal.  The only value would be to counsel your siblings and son since there would be a small possibility that they would harbor the same abnormality.  Being on anticoagulation is not a contraindication to surgery.  That transition is done frequently.  You can take nonsteroidal anti-inflammatories while on anticoagulation.  They do increase the risk of bleeding, but by a totally different mechanism.  Taking high doses of anti-inflammatories have other serious repercussions depending on the dose and the duration of use.  They should be used in a medically prudent way.  This should be discussed with Dr. Azucena Cecil.  A follow-up appointment has been scheduled in 1 month, December 9, to answer any questions or concerns that may arise.  Thank you for coming in this morning! Debbe Odea, MD Hematology/Oncology

## 2018-01-24 NOTE — Progress Notes (Signed)
Outpatient Hematology/Oncology Initial Consultation  Patient Name:  Todd Gardner  DOB: 07/12/1955   Date of Service: January 23, 2018  Referring Provider: Tally Joe, MD [phone] [address] [place], [place] [address]   Consulting Physician: Toni Arthurs, MD Hematology/Oncology  Reason for Referral: In the setting of pulmonary emboli in 1997 and again in 2000, when not on warfarin; reported factor V Leiden gene mutation; currently continuing ongoing anticoagulation with warfarin, he presents now for further discussion and recommendations regarding possible transition to a newer direct oral anticoagulant and the feasibility of hemorrhoidectomy on one of those agents.  History Present Illness: Todd Gardner is a 62 year old resident of [place], originally from [place] whose past medical history is significant for factor V Leiden gene mutation; previous pulmonary embolism in 1997; a second pulmonary embolism in 2000, followed by warfarin to the present; varicose veins in the right lower extremity with previous painful ulceration and inflammation; venous insufficiency; allergic rhinitis; hyperlipidemia; primary hypertension for the past 4 months; degenerative joint disease involving the lower back and right hip; positional dizziness; gastroesophageal reflux disease; and intermittent bleeding internal hemorrhoids.  His primary care physician is Dr. Tally Joe.  He is alone at this first visit.  While residing in [place], following a varicosectomy of the right lower extremity, he developed a pulmonary embolism which was treated initially with 6 months of warfarin. He was well until 2000 after 2 international airline flights, he had a second pulmonary embolism. At that time he was treated with low molecular weight heparin followed by warfarin. He continues on warfarin to the present. He has had no recurrent VTE.  Because of severe venous insufficiency, venous ulcers and  inflammation, he underwent in 2008 laser ablation.  He again underwent a second laser ablation in 2012.  His most recent laser ablation was in 2017.  He has been followed by Dr. Josephina Gip, [facility] [place].  Although those records are not available for review, he was seen previously by a general surgeon for intermittent bleeding internal hemorrhoids.  According to Todd Gardner, they declined surgery because he had a factor V Leiden gene mutation and was on warfarin.  He has had no previous bleeding complications following any surgeries.  His most recent surgery was a tooth extraction in 2018.  For the multiple laser ablations, warfarin was held for 5 days before the procedure and then restarted.  He is interested in transitioning to a newer direct oral anticoagulant for convenience and safety. He is unclear of the efficacy and safety profile.  As  It is unclear where he was tested for factor V Leiden gene mutation.  It is also unclear if he is heterozygous or homozygous.  Likewise no records are available to determine if he was tested for any additional gene mutations or factor deficiencies. His imaging studies identifying pulmonary emboli are not available on the existing chart.  He has no diabetes mellitus, coronary artery disease, angina, or cardiac dysrhythmia.  He is never had a seizure or stroke syndrome.  He reports no thyroid disease. He has no viral hepatitis, inflammatory bowel disease, or symptomatic diverticulosis. He denies kidney or liver disease. He has no rheumatoid or gouty arthritis.  His brother has both venous insufficiency and a reported deep vein thrombosis in the lower extremity.  His maternal grandmother, mother, and sister had colon cancer. His most recent colonoscopy was 18 months ago, reportedly normal. Those results are not available on our chart.  He complains  of scant rectal bleeding after straining at stool, sometimes twice weekly. The blood is not copious. It  is not mixed with stool or in the toilet bowl. It only appears when wiping. He is strongly considering "surgery," but he has not spoke to a gastroenterologist or general surgeon.  He reports no appetite or weight loss.  He denies any rash or itching.  He has no visual changes or hearing deficit.  He has positional dizziness. He denies headache, syncope, or near syncopal episodes.  There is no unusual cough, sore throat, or orthopnea. He has no pain or difficulty in swallowing.  No fever, shaking chills, sweats, or flulike symptoms are evident.  He has occasional heartburn, sometimes twice weekly controlled with Tums. There is no nausea, vomiting, or diarrhea.  He has straining at stool and moves his bowels daily.  He denies melena.  No urinary frequency, urgency, hematuria, or dysuria are evident.  There is no swelling of his ankle.   He denies a spontaneous bleeding tendency. He has chronic lower back pain and right hip pain on an intermittent basis. He was told that nonsteroidal anti-inflammatories are contraindicated with warfarin.  His leg ulcers have resolved completely.  He wears support stockings.  There is no numbness or tingling in the fingers or toes.  It is with this background he presents now for further discussion and recommendations in the setting of pulmonary embolism in 1997, and again in 2000; reported factor V Leiden gene mutation; ongoing anticoagulation with warfarin; requesting input regarding transition to a newer direct oral anticoagulant and the feasibility of hemorrhoidectomy if necessary in that setting as outlined above.  Past Medical History:  Diagnosis Date  . Clotting disorder (HCC)   . Factor V Leiden (HCC)    positive  . Hemorrhoids   . Hernia   . History of blood clots    Pulmonary embolism  . Jock itch 12-20-11   mild case at present  . Poor circulation   . Pulmonary embolism (HCC) 1997  . Ulcer    leg  . Wears glasses    Past Surgical History:  Procedure  Laterality Date  . HAND SURGERY     Left  . INGUINAL HERNIA REPAIR  12/23/2011   Procedure: HERNIA REPAIR INGUINAL ADULT;  Surgeon: Adolph Pollack, MD;  Location: [facility];  Service: General;  Laterality: Left;  Left Inguinal Hernia with Mesh  . teeth extraction  01-01-2011  3 teeth extracted by oral surgeon  . vein ablation surgery  8/12 and 12/08  . vericose vein surgery  1997   R leg   Family History  Problem Relation Age of Onset  . Colon cancer Sister   . Cancer Maternal Grandfather        colon  Mother: Deceased: Age 69 years: Colon cancer/varicose veins Father: Age 69 years: Factor V Leiden gene mutation Brothers (3): Age 27 years: Superficial varicosities/DVT Sisters (2): Deceased: Age 56 years: Colon cancer  Social History   Socioeconomic History  . Marital status: Married    Spouse name: Not on file  . Number of children: Y  . Years of education: Not on file  . Highest education level: Not on file  Occupational History  . Occupation: Firefighter: [employer]  Social Needs  . Financial resource strain: Not on file  . Food insecurity:    Worry: Not on file    Inability: Not on file  . Transportation needs:  Medical: Not on file    Non-medical: Not on file  Tobacco Use  . Smoking status: Never Smoker  . Smokeless tobacco: Never Used  Substance and Sexual Activity  . Alcohol use: Yes    Alcohol/week: 2.0 standard drinks    Types: 2 Cans of beer per week    Comment: 3 dks per week  . Drug use: No  . Sexual activity: Yes  Lifestyle  . Physical activity:    Days per week: Not on file    Minutes per session: Not on file  . Stress: Not on file  Relationships  . Social connections:    Talks on phone: Not on file    Gets together: Not on file    Attends religious service: Not on file    Active member of club or organization: Not on file    Attends meetings of clubs or organizations: Not on file    Relationship status: Not  on file  . Intimate partner violence:    Fear of current or ex partner: Not on file    Emotionally abused: Not on file    Physically abused: Not on file    Forced sexual activity: Not on file  Other Topics Concern  . Not on file  Social History Narrative   Moved here from [place].  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 He has been married for 41 years. He has 1 child without major medical problems.   He tested negative for factor V Leiden gene mutation. He is a lifetime non-smoker. His alcohol intake consist of 4 beers weekly. He has no significant use of pipe, cigars, or chewing tobacco. Reports no recreational drug abuse.  Transfusion History: No prior transfusion  Exposure History: He has no known exposure to toxic chemicals, radiation, or pesticides  Allergies  Allergen Reactions  . Lidocaine     topical   Current Outpatient Medications on File Prior to Visit  Medication Sig  . loratadine (CLARITIN) 10 MG tablet Take 10 mg by mouth daily.  Marland Kitchen losartan (COZAAR) 100 MG tablet Take 100 mg by mouth daily.  Marland Kitchen warfarin (COUMADIN) 5 MG tablet Take 5 mg by mouth daily.     No current facility-administered medications on file prior to visit.     Review of Systems: Constitutional: No fever, sweats, or shaking chills.  No appetite or weight deficit. Skin: No rash, scaling, sores, lumps, or jaundice. HEENT: No visual changes or hearing deficit. Pulmonary: No unusual cough, sore throat, or orthopnea. Cardiovascular: No coronary artery disease, angina, or myocardial infarction.  No cardiac dysrhythmia; essential hypertension. No dyslipidemia. Gastrointestinal: No indigestion, dysphagia, abdominal pain, diarrhea; occasional straining at stool/mild constipation. No change in bowel habits; dyspepsia twice weekly; chronic internal bleeding hemorrhoids (twice weekly when wiping). Genitourinary: No urinary frequency, urgency, hematuria, or dysuria. Musculoskeletal: No arthralgias or myalgias;  chronic lower back and right hip pain; no joint swelling, pain, or instability. Hematologic: Easy bruisability on warfarin. Endocrine: No intolerance to hot or cold; no thyroid disease or diabetes mellitus. Vascular: No peripheral arterial disease; venous ulcers, now resolved; venous insufficiency; extensive varicosities; previous laser ablation; no deep vein thrombosis.  Prior pulmonary emboli (2). Factor V Leiden gene mutation. Psychological: No anxiety, depression, or mood changes; no mental health illnesses. Neurological: Positional dizziness and lightheadedness.  No headache, syncope, or near syncopal episodes; no numbness or tingling in the fingers or toes.  Physical Examination: Vital Signs: Body surface area is 2.3 meters squared.  Vitals:   01/23/18 1125  BP: 134/66  Pulse: (!) 52  Resp: 18  Temp: 98.2 F (36.8 C)  SpO2: 98%    Filed Weights   01/23/18 1125  Weight: 221 lb 3.2 oz (100.3 kg)  ECOG PERFORMANCE STATUS: 0 Constitutional:  Todd Gardner is fully nourished and developed.  He looks age appropriate.  He is friendly and cooperative without respiratory compromise at rest. Skin: No rashes, scaling, dryness, jaundice, or itching. HEENT: Head is normocephalic and atraumatic.  Pupils are equal round and reactive to light and accommodation.  Sclerae are anicteric.  Conjunctivae are pink.  No sinus tenderness nor oropharyngeal lesions.  Lips without cracking or peeling; tongue without mass, inflammation, or nodularity.  Mucous membranes are moist. Neck: Supple and symmetric.  No jugular venous distention or thyromegaly.  Trachea is midline. Lymphatics: No cervical or supraclavicular lymphadenopathy.  No epitrochlear, axillary, or inguinal lymphadenopathy is appreciated. Respiratory/chest: Thorax is symmetrical.  Breath sounds are clear to auscultation and percussion.  Normal excursion and respiratory effort. Back: Symmetric without deformity or tenderness. Cardiovascular: Heart  rate and rhythm are regular. Gastrointestinal: Abdomen is soft, nontender; no organomegaly.  Bowel sounds are normoactive.  No masses are appreciated. Genitourinary: Normal external male genitalia. Extremities: In the lower extremities, there is no asymmetric swelling, erythema, tenderness, or cord formation.  No clubbing, cyanosis, nor edema. Hematologic: No petechiae, hematomas, or ecchymoses. Psychological:  He is oriented to person, place, and time; normal affect, memory, and cognition. Neurological: There are no gross neurologic deficits.  Diagnostic/Imaging Studies: CT ABDOMEN AND PELVIS WITHOUT CONTRAST  TECHNIQUE: Multidetector CT imaging of the abdomen and pelvis was performed following the standard protocol without IV contrast.  COMPARISON:  None.  FINDINGS: Lower chest: No acute abnormality.  Hepatobiliary: No focal liver abnormality is seen. No gallstones, gallbladder wall thickening, or biliary dilatation.  Pancreas: Unremarkable. No pancreatic ductal dilatation or surrounding inflammatory changes.  Spleen: Normal in size without focal abnormality.  Adrenals/Urinary Tract: Adrenal glands appear normal. Multiple left renal cysts are noted. No hydronephrosis or renal obstruction is noted. No renal or ureteral calculi are noted.  Stomach/Bowel: Stomach is within normal limits. Appendix appears normal. No evidence of bowel wall thickening, distention, or inflammatory changes.  Vascular/Lymphatic: No significant vascular findings are present. No enlarged abdominal or pelvic lymph nodes.  Reproductive: Prostate is unremarkable.  Other: No abdominal wall hernia or abnormality. No abdominopelvic ascites.  Musculoskeletal: No acute or significant osseous findings.  IMPRESSION: No significant abnormality seen in the abdomen or pelvis.  Todd Gardner, M.D. 11/05/2017 12:30  Summary/Assessment: In the setting of pulmonary emboli in 1997 and again  in 2000, when not on warfarin; reported factor V Leiden gene mutation; currently continuing ongoing anticoagulation with warfarin, he presents now for further discussion and recommendations regarding possible transition to a newer direct oral anticoagulant and the feasibility of hemorrhoidectomy on one of those agents.  While residing in [place]; following a varicosectomy of the right lower extremity, he developed a pulmonary embolism which was treated initially with 6 months of warfarin.  He was well until 2000 after 2 international airline flights, he had a second pulmonary embolism. At that time he was treated with low molecular weight heparin followed by warfarin. He continues on warfarin to the present. He has had no recurrent VTE.  Because of severe venous insufficiency, venous ulcers and inflammation, he underwent in 2008 laser ablation.  He again underwent a second laser ablation in 2012.  His most recent laser ablation was in 2017.  He has been followed by Dr. Josephina Gip, [facility] in [place].  Although those records are not available for review, he was seen previously by a general surgeon for intermittent bleeding internal hemorrhoids.  According to Todd Gardner, they declined surgery because he had a factor V Leiden gene mutation and was on warfarin.  He has had no previous bleeding complications following any surgeries.  His most recent surgery was a tooth extraction in 2018.  For the multiple laser ablations, warfarin was held for 5 days before the procedure and then restarted.  He is interested in transitioning to a newer direct oral anticoagulant for convenience and safety. He is unclear of the efficacy and safety profile.    It is unclear where he was tested for factor V Leiden gene mutation.  It is also unclear if he is heterozygous or homozygous.  Likewise no records are available to determine if he was tested for any additional gene mutations or factor deficiencies. His  imaging studies identifying pulmonary emboli are not available on the existing chart. His brother has both venous insufficiency and a reported deep vein thrombosis in the lower extremity.  His maternal grandmother, mother, and sister had colon cancer.   His most recent colonoscopy was 18 months ago, reportedly normal. Those results are not available on our chart.  He complains of scant rectal bleeding after straining at stool, sometimes twice weekly. The blood is not copious. It is not mixed with stool or in the toilet bowl. It only appears when wiping. He is strongly considering "surgery," but he has not spoke to a gastroenterologist or general surgeon.  He reports no appetite or weight loss.  He denies any rash or itching.  He has no visual changes or hearing deficit.  He has positional dizziness. He denies headache, syncope, or near syncopal episodes.  There is no unusual cough, sore throat, or orthopnea. He has no pain or difficulty in swallowing.  No fever, shaking chills, sweats, or flulike symptoms are evident.  He has occasional heartburn, sometimes twice weekly controlled with Tums.   There is no nausea, vomiting, or diarrhea.  He has straining at stool and moves his bowels daily.  He denies melena.  No urinary frequency, urgency, hematuria, or dysuria are evident.  There is no swelling of his ankle. He denies any spontaneous bleeding tendency. He has chronic lower back pain and right hip pain on an intermittent basis. He was told that nonsteroidal anti-inflammatories are contraindicated with warfarin. His leg ulcers have resolved completely.  He wears support stockings. There is no numbness or tingling in the fingers or toes.    Recommendation/Plan: I recommended if possible that he obtain his medical records from 2000.  Although unlikely, medical records from 1997 would be very desirable. These records might even include the results of factor V Leiden gene mutation or a hypercoagulable  evaluation.  Because he has had 2 separate pulmonary emboli when not on anticoagulation, and he has a known factor V Leiden gene mutation, generally long-term anticoagulation is preferred.  Although I discussed a hypercoagulable evaluation for completeness, unless he is anticipating stopping anticoagulation (which he is not) then there will be no benefit to testing since the results of the test will not affect his decision to continue anticoagulation.  He will continue anticoagulation regardless. His only offspring has apparently been tested for at least factor V Leiden gene mutation. He has no history of VTE.  Given his prior history, currently on anticoagulation with warfarin,  it is possible to transition to either rivaroxaban or apixaban.  Warfarin is held and the newer oral anticoagulants are restarted when the INR is below 2.0.  It is initiated without the high dose induction phase.  As an example, when the INR is below 2.0, apixaban is started at 5 mg twice daily.  This can be done at any time under the direction of Dr. Azucena Cecil.  With regard to invasive procedures, it depends somewhat on the invasive procedure and the associated risk of bleeding and/or thrombosis.  For relatively low risk procedures, the direct oral anticoagulant is held for 2 days prior to the procedure and then restarted once adequate hemostasis is determined.  It can be restarted at the same prior dose.  Because there are potentially other options available other than a surgical solution to internal hemorrhoids, I recommended that he discuss either a gastroenterology or general surgical consultation for their opinion. This can be facilitated through Dr. Azucena Cecil.  For his back and right hip pain he has been taking acetaminophen with very limited benefit.  Although nonsteroidal anti-inflammatories, such as ibuprofen, are not without potential adverse side effects, there is no specific interaction with warfarin or the newer direct oral  anticoagulants that would make them prohibitive if both renal and liver function are normal.  With warfarin, risk of bleeding increases when the INR is above 2.5. However concomitant use of any drug that affects hemostasis increases the risk of bleeding. There is always the risk of gastrointestinal bleeding with NSAIDs. They interfere specifically with platelet function, and do not interfere with the function of direct oral anticoagulants. He was advised to discuss this in greater detail with Dr. Azucena Cecil.  Barring any unforeseen complications, a follow-up visit to answer any unresolved questions has been scheduled for February 23, 2018.  He was advised to call us in the interim should any new or untoward problems arise.  The total time spent discussing his prior history with venous thromboembolic disease, feasibility of converting to apixaban or rivaroxaban instead of warfarin, general management of anticoagulation during any invasive procedure, possibility of a nonsurgical solution to his internal hemorrhoids, and general recommendations were 70 minutes.  At least 50% of that time was spent in discussion, counseling, and answering questions. There was ample time allotted to answer all of his questions.  This note was dictated using voice activated technology/software.  Unfortunately, typographical errors are not uncommon, and transcription is subject to mistakes and regrettably misinterpretation.  If necessary, clarification of the above information can be discussed with me at any time.  Thank you Dr. Azucena Cecil for allowing my participation in the care of Todd Gardner. Please do not hesitate to call should any questions arise regarding this initial consultation and discussion.  FOLLOW UP: AS DIRECTED   cc:          Tally Joe, MD   Toni Arthurs, MD  Hematology/Oncology [facility] [address] [place], [place] [address] Office: [phone] Main: [phone]

## 2018-01-27 ENCOUNTER — Telehealth: Payer: Self-pay | Admitting: *Deleted

## 2018-01-27 NOTE — Telephone Encounter (Signed)
Faxed office notes to Dr. Tally Joe @ [facility] as per Dr. Wilford Grist instructions. Phone      [phone]         Fax       [phone].

## 2018-02-24 ENCOUNTER — Ambulatory Visit: Payer: No Typology Code available for payment source | Admitting: Hematology and Oncology

## 2018-03-02 ENCOUNTER — Encounter: Payer: Self-pay | Admitting: Cardiology

## 2018-03-02 ENCOUNTER — Ambulatory Visit: Payer: No Typology Code available for payment source | Admitting: Cardiology

## 2018-03-02 VITALS — BP 124/76 | HR 81 | Ht 75.0 in | Wt 222.1 lb

## 2018-03-02 DIAGNOSIS — R011 Cardiac murmur, unspecified: Secondary | ICD-10-CM

## 2018-03-02 DIAGNOSIS — I493 Ventricular premature depolarization: Secondary | ICD-10-CM

## 2018-03-02 DIAGNOSIS — Z86711 Personal history of pulmonary embolism: Secondary | ICD-10-CM | POA: Diagnosis not present

## 2018-03-02 DIAGNOSIS — D6851 Activated protein C resistance: Secondary | ICD-10-CM | POA: Diagnosis not present

## 2018-03-02 NOTE — Progress Notes (Signed)
Cardiology Office Note:    Date:  03/02/2018   ID:  Todd Gardner, DOB 05/21/1955, MRN [id number]  PCP:  Todd Joe, MD  Cardiologist:  No primary care provider on file.  Electrophysiologist:  None   Referring MD: Todd Joe, MD     History of Present Illness:    Todd Gardner is a 62 y.o. male here for the evaluation of heart murmur at the request of Todd Gardner.  He has factor V Leiden deficiency (unclear when he was tested and unclear if homozygous or heterozygous) followed by Todd Gardner of oncology.  In review of clinic note from 01/23/2018 he is originally from [place].  Had prior PE in 1997.  Had a second PE in 2000.  Varicose veins.  Has hyperlipidemia and hypertension.  Warfarin.  He had a laser ablation in 2008, 2012 and 2017.  Todd Gardner.  It was felt that if he would like to switch over to Eliquis this would be reasonable when his INR is below 2.0.  5 mg twice a day.  Has been bothered by hemorrhoid.  Approximately 3 months ago he had started losartan because of hypertension.  He just happened to have a dental cleaning and his dental hygienist noticed that he had a heart murmur.  This was confirmed by Todd Gardner.  He is also noted that if he stands up too quickly he may feel dizzy.  His blood pressure today was 124/76 on 100 of losartan.  He is not feeling any palpitations, no chest pain, no shortness of breath.  No fevers chills nausea vomiting syncope.  He does not drink any excessive caffeine.  No Sudafed.  Todd Gardner died, Todd Gardner no CAD  Past Medical History:  Diagnosis Date  . Clotting disorder (HCC)   . Factor V Leiden (HCC)    positive  . Hemorrhoids   . Hernia   . History of blood clots    Pulmonary embolism  . Jock itch 12-20-11   mild case at present  . Poor circulation   . Pulmonary embolism (HCC) 1997  . Ulcer    leg  . Wears glasses     Past Surgical History:  Procedure Laterality Date  . HAND SURGERY     Left  . INGUINAL  HERNIA REPAIR  12/23/2011   Procedure: HERNIA REPAIR INGUINAL ADULT;  Surgeon: Todd Pollack, MD;  Location: [facility];  Service: General;  Laterality: Left;  Left Inguinal Hernia with Mesh  . teeth extraction  01-01-2011  3 teeth extracted by oral surgeon  . vein ablation surgery  8/12 and 12/08  . vericose vein surgery  1997   R leg    Current Medications: Current Meds  Medication Sig  . Cholecalciferol (VITAMIN D) 50 MCG (2000 UT) CAPS Take 4,000 Units by mouth daily.  Marland Kitchen loratadine (CLARITIN) 10 MG tablet Take 10 mg by mouth daily.  Marland Kitchen losartan (COZAAR) 100 MG tablet Take 100 mg by mouth daily.  Marland Kitchen warfarin (COUMADIN) 5 MG tablet Take 5 mg by mouth daily.       Allergies:   Lidocaine   Social History   Socioeconomic History  . Marital status: Married    Spouse name: Not on file  . Number of children: Y  . Years of education: Not on file  . Highest education level: Not on file  Occupational History  . Occupation: Firefighter: [employer]  Social Needs  . Physicist, medical  strain: Not on file  . Food insecurity:    Worry: Not on file    Inability: Not on file  . Transportation needs:    Medical: Not on file    Non-medical: Not on file  Tobacco Use  . Smoking status: Never Smoker  . Smokeless tobacco: Never Used  Substance and Sexual Activity  . Alcohol use: Yes    Alcohol/week: 2.0 standard drinks    Types: 2 Cans of beer per week    Comment: 3 dks per week  . Drug use: No  . Sexual activity: Yes  Lifestyle  . Physical activity:    Days per week: Not on file    Minutes per session: Not on file  . Stress: Not on file  Relationships  . Social connections:    Talks on phone: Not on file    Gets together: Not on file    Attends religious service: Not on file    Active member of club or organization: Not on file    Attends meetings of clubs or organizations: Not on file    Relationship status: Not on file  Other Topics Concern    . Not on file  Social History Narrative   Moved here from [place].     Family History: The patient's family history includes Cancer in his maternal grandfather; Colon cancer in his sister.  ROS:   Please see the history of present illness.     All other systems reviewed and are negative.  EKGs/Labs/Other Studies Reviewed:    The following studies were reviewed today: Office notes, lab work, EKG  EKG:  EKG is  ordered today.  The ekg ordered today demonstrates sinus rhythm occasional PVCs noted 81 bpm personally reviewed and interpreted.  Recent Labs: No results found for requested labs within last 8760 hours.  Recent Lipid Panel No results found for: CHOL, TRIG, HDL, CHOLHDL, VLDL, LDLCALC, LDLDIRECT  Physical Exam:    VS:  BP 124/76   Pulse 81   Ht 6\' 3"  (1.905 m)   Wt 222 lb 1.9 oz (100.8 kg)   SpO2 97%   BMI 27.76 kg/m     Wt Readings from Last 3 Encounters:  03/02/18 222 lb 1.9 oz (100.8 kg)  01/23/18 221 lb 3.2 oz (100.3 kg)  09/03/17 216 lb (98 kg)     GEN:  Well nourished, well developed in no acute distress HEENT: Normal NECK: No JVD; No carotid bruits LYMPHATICS: No lymphadenopathy CARDIAC: RRR occasional ectopy, no murmurs, rubs, gallops RESPIRATORY:  Clear to auscultation without rales, wheezing or rhonchi  ABDOMEN: Soft, non-tender, non-distended MUSCULOSKELETAL:  No edema; No deformity  SKIN: Warm and dry NEUROLOGIC:  Alert and oriented x 3 PSYCHIATRIC:  Normal affect   ASSESSMENT:    1. Heart murmur   2. PVC (premature ventricular contraction)   3. Personal history of pulmonary embolism   4. Factor V Leiden (HCC)    PLAN:    In order of problems listed above:  Heart murmur -We will check an echocardiogram.  Actually, what I think the dental hygienist was calling a heart murmur may have been the ectopic beats from his PVCs.  Nonetheless, I will make sure that he has an echocardiogram to ensure proper structure and function.  He may  proceed with surgical procedures if necessary.  Coumadin management per Dr. Azucena CecilSwayne.  PVCs - One isolated PVC was noted on his EKG.  I did hear a few ectopic beats when auscultating him.  Recommend conservative management as long as pump function is reasonable.  Recurrent venous thrombosis, factor V Leiden deficiency - Followed by hematology oncology.  Seems quite reasonable to transition to Eliquis.   Medication Adjustments/Labs and Tests Ordered: Current medicines are reviewed at length with the patient today.  Concerns regarding medicines are outlined above.  Orders Placed This Encounter  Procedures  . ECHOCARDIOGRAM COMPLETE   No orders of the defined types were placed in this encounter.   Patient Instructions  Medication Instructions:  Your physician recommends that you continue on your current medications as directed. Please refer to the Current Medication list given to you today.  If you need a refill on your cardiac medications before your next appointment, please call your pharmacy.   Lab work: None If you have labs (blood work) drawn today and your tests are completely normal, you will receive your results only by: Marland Kitchen MyChart Message (if you have MyChart) OR . A paper copy in the mail If you have any lab test that is abnormal or we need to change your treatment, we will call you to review the results.  Testing/Procedures: Your physician has requested that you have an echocardiogram. Echocardiography is a painless test that uses sound waves to create images of your heart. It provides your doctor with information about the size and shape of your heart and how well your heart's chambers and valves are working. This procedure takes approximately one hour. There are no restrictions for this procedure.   Follow-Up: Your physician recommends that you schedule a follow-up appointment as needed with Dr. Anne Fu.     Any Other Special Instructions Will Be Listed Below (If  Applicable)      Signed, Donato Schultz, MD  03/02/2018 3:24 PM    [facility]

## 2018-03-02 NOTE — Patient Instructions (Signed)
Medication Instructions:  Your physician recommends that you continue on your current medications as directed. Please refer to the Current Medication list given to you today.  If you need a refill on your cardiac medications before your next appointment, please call your pharmacy.   Lab work: None If you have labs (blood work) drawn today and your tests are completely normal, you will receive your results only by: Marland Kitchen. MyChart Message (if you have MyChart) OR . A paper copy in the mail If you have any lab test that is abnormal or we need to change your treatment, we will call you to review the results.  Testing/Procedures: Your physician has requested that you have an echocardiogram. Echocardiography is a painless test that uses sound waves to create images of your heart. It provides your doctor with information about the size and shape of your heart and how well your heart's chambers and valves are working. This procedure takes approximately one hour. There are no restrictions for this procedure.   Follow-Up: Your physician recommends that you schedule a follow-up appointment as needed with Dr. Anne FuSkains.     Any Other Special Instructions Will Be Listed Below (If Applicable)

## 2018-03-04 ENCOUNTER — Ambulatory Visit: Payer: No Typology Code available for payment source | Admitting: Vascular Surgery

## 2018-03-04 NOTE — Addendum Note (Signed)
Addended by: Cleda MccreedyGARRISON, Cherie Lasalle on: 03/04/2018 07:43 AM   Modules accepted: Orders

## 2018-03-09 ENCOUNTER — Other Ambulatory Visit (HOSPITAL_COMMUNITY): Payer: No Typology Code available for payment source

## 2018-04-03 ENCOUNTER — Ambulatory Visit (HOSPITAL_COMMUNITY): Payer: No Typology Code available for payment source | Attending: Cardiology

## 2018-04-03 ENCOUNTER — Encounter (INDEPENDENT_AMBULATORY_CARE_PROVIDER_SITE_OTHER): Payer: Self-pay

## 2018-04-03 ENCOUNTER — Other Ambulatory Visit: Payer: Self-pay

## 2018-04-03 DIAGNOSIS — R011 Cardiac murmur, unspecified: Secondary | ICD-10-CM | POA: Diagnosis not present

## 2018-08-26 IMAGING — CT CT ABD-PELV W/O CM
1 of 2 series · 14 of 32 positions shown, 19 images · non-contrast
Comparison: None.

CLINICAL DATA: Left inguinal pain for several months.

EXAM:
CT ABDOMEN AND PELVIS WITHOUT CONTRAST
TECHNIQUE: Multidetector CT imaging of the abdomen and pelvis was performed
following the standard protocol without IV contrast.

[Series 2: abd/pelvis w/(date) · axial · 0.68mm/px · z∈[-489,-74]mm · 14 of 93 slices shown, 19 images]
[im 5/93  soft-tissue]
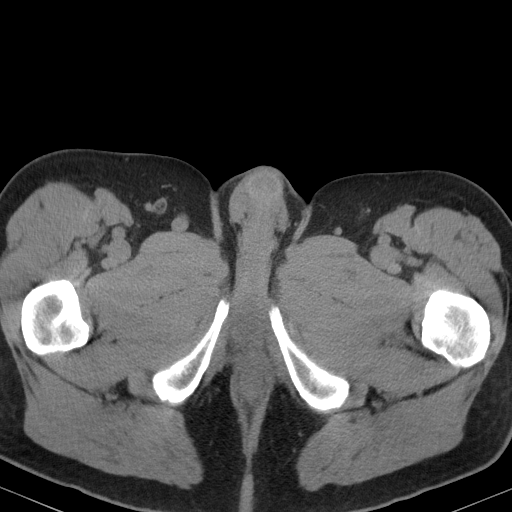
[im 5/93  bone]
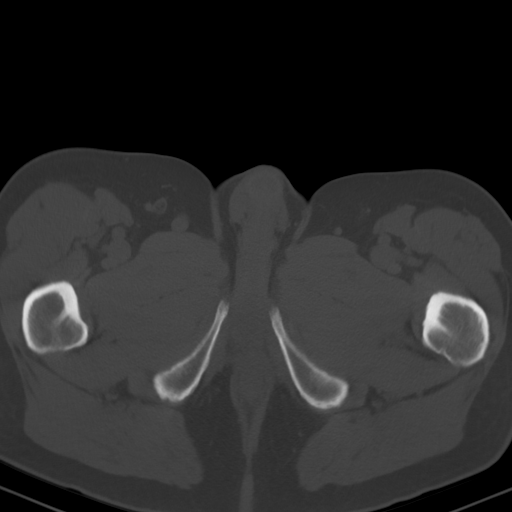
[im 14/93  soft-tissue]
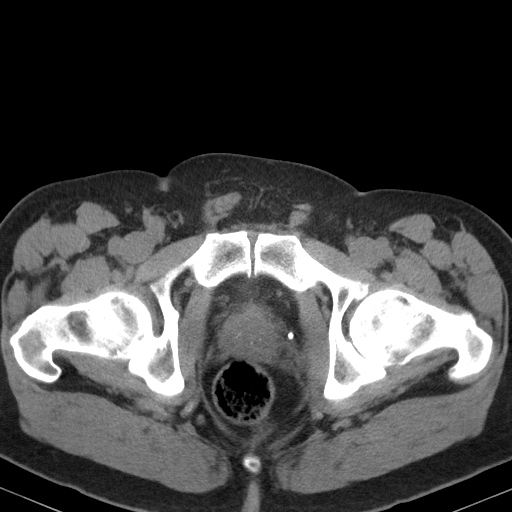
[im 19/93  soft-tissue]
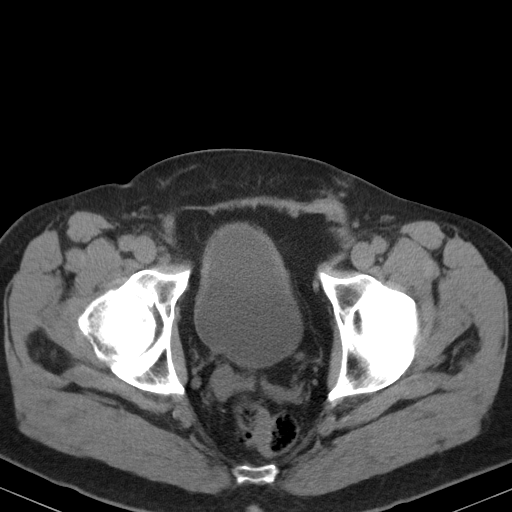
[im 28/93  soft-tissue]
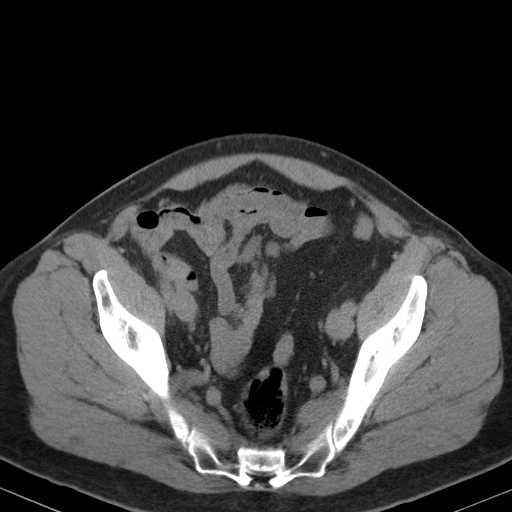
[im 33/93  soft-tissue]
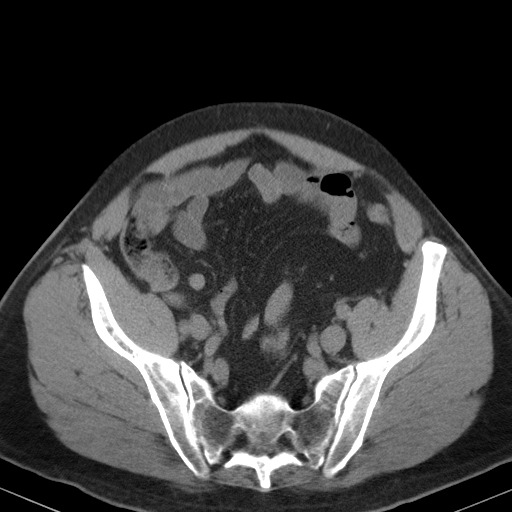
[im 42/93  soft-tissue]
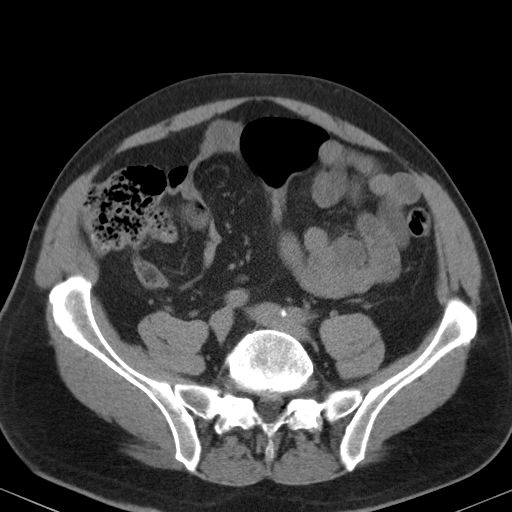
[im 47/93  soft-tissue]
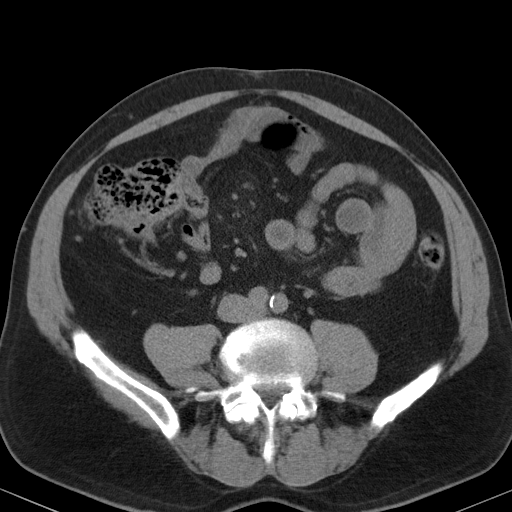
[im 51/93  soft-tissue]
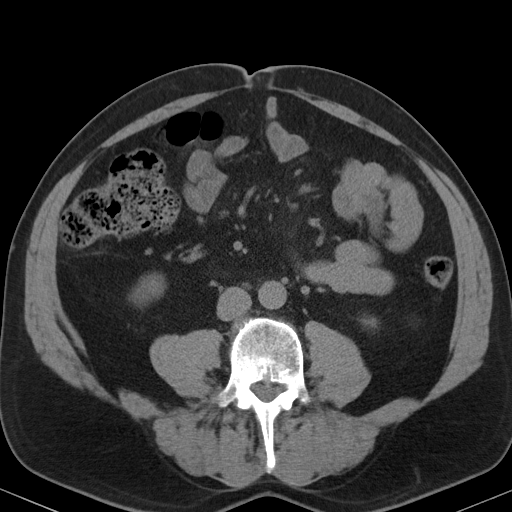
[im 60/93  soft-tissue]
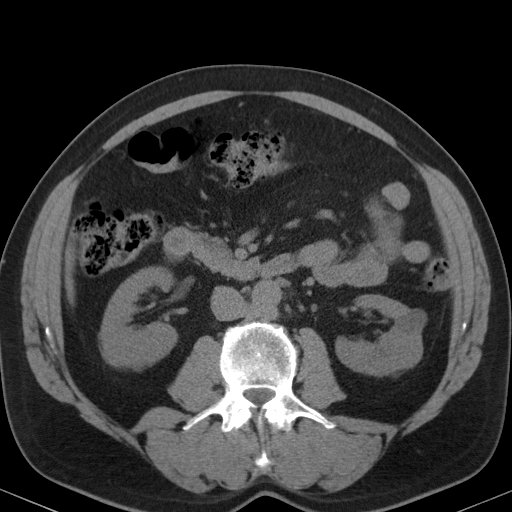
[im 60/93  bone]
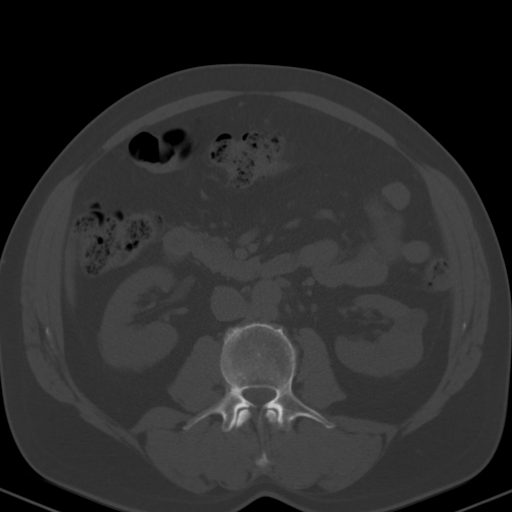
[im 65/93  soft-tissue]
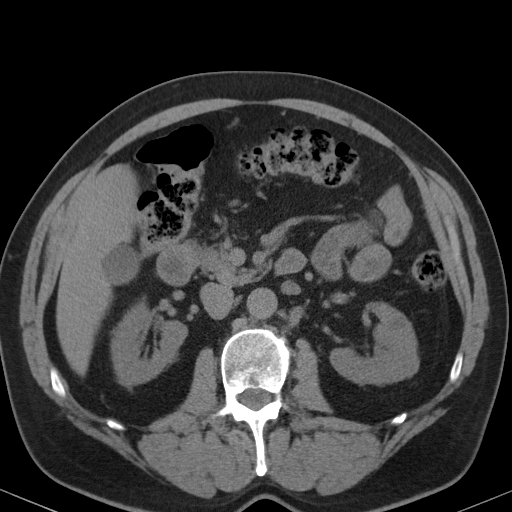
[im 74/93  soft-tissue]
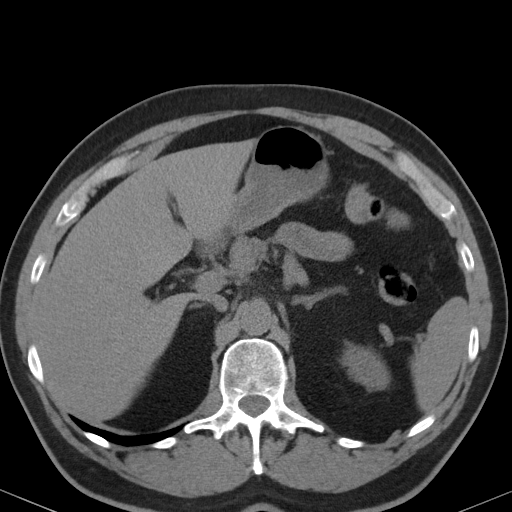
[im 74/93  lung]
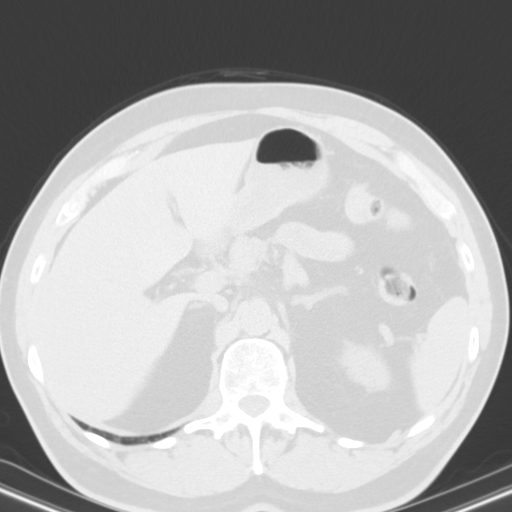
[im 79/93  soft-tissue]
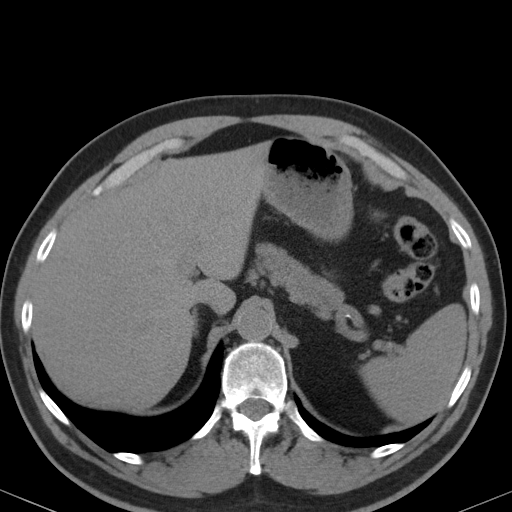
[im 79/93  lung]
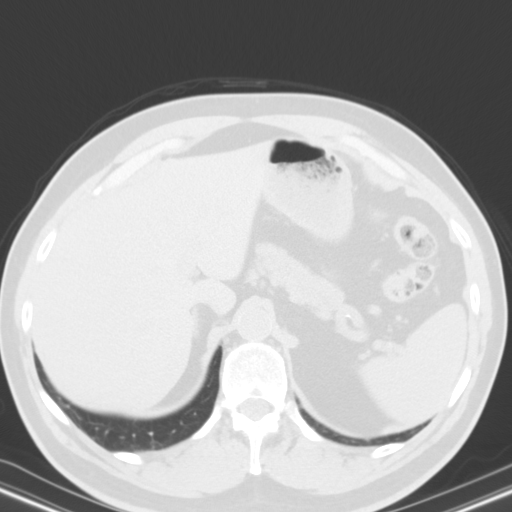
[im 83/93  lung]
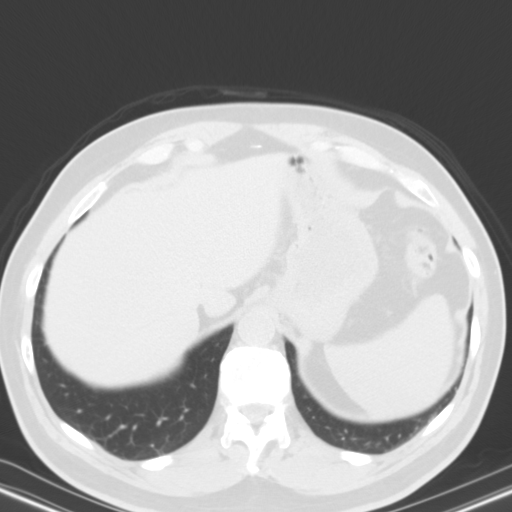
[im 88/93  soft-tissue]
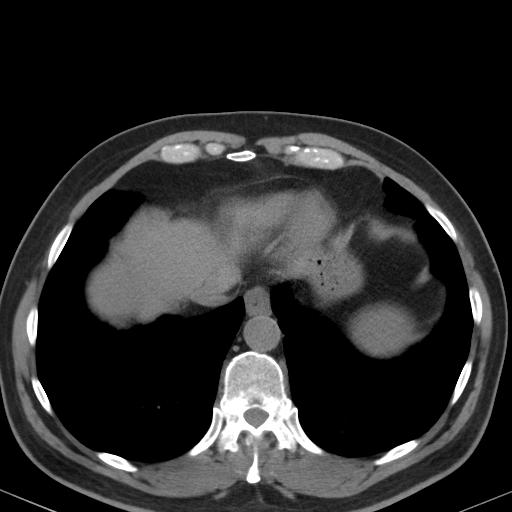
[im 88/93  lung]
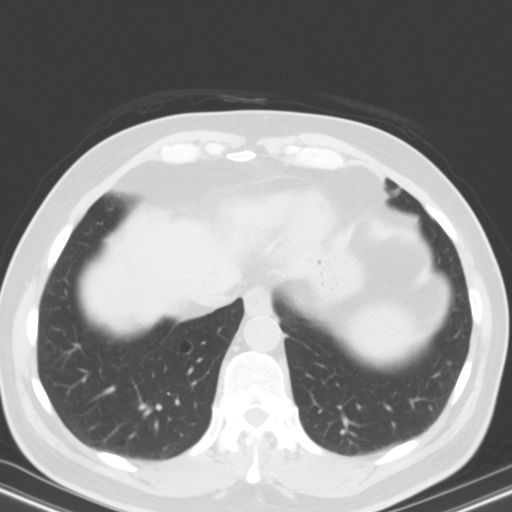

[14 of 32 positions shown; findings below may reference images not displayed]

FINDINGS: Lower chest: No acute abnormality.

Hepatobiliary: No focal liver abnormality is seen. No gallstones,
gallbladder wall thickening, or biliary dilatation.

Pancreas: Unremarkable. No pancreatic ductal dilatation or
surrounding inflammatory changes.

Spleen: Normal in size without focal abnormality.

Adrenals/Urinary Tract: Adrenal glands appear normal. Multiple left
renal cysts are noted. No hydronephrosis or renal obstruction is
noted. No renal or ureteral calculi are noted.

Stomach/Bowel: Stomach is within normal limits. Appendix appears
normal. No evidence of bowel wall thickening, distention, or
inflammatory changes.

Vascular/Lymphatic: No significant vascular findings are present. No
enlarged abdominal or pelvic lymph nodes.

Reproductive: Prostate is unremarkable.

Other: No abdominal wall hernia or abnormality. No abdominopelvic
ascites.

Musculoskeletal: No acute or significant osseous findings.
IMPRESSION: No significant abnormality seen in the abdomen or pelvis.

## 2019-06-03 ENCOUNTER — Encounter: Payer: Self-pay | Admitting: Cardiology

## 2019-06-03 ENCOUNTER — Ambulatory Visit: Payer: BC Managed Care – PPO | Attending: Internal Medicine

## 2019-06-03 ENCOUNTER — Ambulatory Visit: Payer: BC Managed Care – PPO | Admitting: Cardiology

## 2019-06-03 ENCOUNTER — Other Ambulatory Visit: Payer: Self-pay

## 2019-06-03 VITALS — BP 128/80 | HR 78 | Ht 75.0 in | Wt 206.0 lb

## 2019-06-03 DIAGNOSIS — D6851 Activated protein C resistance: Secondary | ICD-10-CM

## 2019-06-03 DIAGNOSIS — Z23 Encounter for immunization: Secondary | ICD-10-CM

## 2019-06-03 DIAGNOSIS — I493 Ventricular premature depolarization: Secondary | ICD-10-CM

## 2019-06-03 MED ORDER — LOSARTAN POTASSIUM 25 MG PO TABS: 25.0000 mg | ORAL_TABLET | Freq: Every day | ORAL | 3 refills | Status: DC

## 2019-06-03 NOTE — Progress Notes (Signed)
   Covid-19 Vaccination Clinic  Name:  HAYWOOD MEINDERS    MRN: [id number] DOB: January 16, 1956  06/03/2019  Mr. Fournet was observed post Covid-19 immunization for 15 minutes without incident. He was provided with Vaccine Information Sheet and instruction to access the V-Safe system.   Mr. Brinkmeier was instructed to call 911 with any severe reactions post vaccine: Marland Kitchen Difficulty breathing  . Swelling of face and throat  . A fast heartbeat  . A bad rash all over body  . Dizziness and weakness   Immunizations Administered    Name Date Dose VIS Date Route   Pfizer COVID-19 Vaccine 06/03/2019  2:56 PM 0.3 mL 02/26/2019 Intramuscular   Manufacturer: [employer], [employer]   Lot: [id number]   NDC: [address]-7

## 2019-06-03 NOTE — Progress Notes (Signed)
Cardiology Office Note:    Date:  06/03/2019   ID:  Todd Gardner, DOB June 03, 1955, MRN [id number]  PCP:  Tally Joe, MD  Cardiologist:  Donato Schultz, MD  Electrophysiologist:  None   Referring MD: Tally Joe, MD     History of Present Illness:    Todd Gardner is a 64 y.o. male here for follow-up of hypertension, prior PE, on chronic anticoagulation with Eliquis, factor V Leiden deficiency.  EKG today shows sinus rhythm with ventricular bigeminy heart rate 78 bpm.  He showed me his blood pressure readings.  He had decreased his losartan after retiring, decrease stress and weight loss.  He still feeling some midday dizziness.  No syncope.  No significant shortness of breath or chest pain.  Tolerating the medications well.  Enjoys taking Eliquis versus Coumadin.  Getting first Covid vaccine today.  Past Medical History:  Diagnosis Date  . Clotting disorder (HCC)   . Factor V Leiden (HCC)    positive  . Hemorrhoids   . Hernia   . History of blood clots    Pulmonary embolism  . Jock itch 12-20-11   mild case at present  . Poor circulation   . Pulmonary embolism (HCC) 1997  . Ulcer    leg  . Wears glasses     Past Surgical History:  Procedure Laterality Date  . HAND SURGERY     Left  . INGUINAL HERNIA REPAIR  12/23/2011   Procedure: HERNIA REPAIR INGUINAL ADULT;  Surgeon: Adolph Pollack, MD;  Location: [facility];  Service: General;  Laterality: Left;  Left Inguinal Hernia with Mesh  . teeth extraction  01-01-2011  3 teeth extracted by oral surgeon  . vein ablation surgery  8/12 and 12/08  . vericose vein surgery  1997   R leg    Current Medications: Current Meds  Medication Sig  . Cholecalciferol (VITAMIN D) 50 MCG (2000 UT) CAPS Take 4,000 Units by mouth daily.  Marland Kitchen ELIQUIS 5 MG TABS tablet Take 5 mg by mouth 2 (two) times daily.  Marland Kitchen loratadine (CLARITIN) 10 MG tablet Take 10 mg by mouth daily.  . [DISCONTINUED] losartan (COZAAR) 50 MG tablet Take 50 mg by mouth  daily.     Allergies:   Lidocaine   Social History   Socioeconomic History  . Marital status: Married    Spouse name: Not on file  . Number of children: Y  . Years of education: Not on file  . Highest education level: Not on file  Occupational History  . Occupation: Firefighter: [employer]  Tobacco Use  . Smoking status: Never Smoker  . Smokeless tobacco: Never Used  Substance and Sexual Activity  . Alcohol use: Yes    Alcohol/week: 2.0 standard drinks    Types: 2 Cans of beer per week    Comment: 3 dks per week  . Drug use: No  . Sexual activity: Yes  Other Topics Concern  . Not on file  Social History Narrative   Moved here from [place].   Social Determinants of Health   Financial Resource Strain:   . Difficulty of Paying Living Expenses:   Food Insecurity:   . Worried About Programme researcher, broadcasting/film/video in the Last Year:   . Barista in the Last Year:   Transportation Needs:   . Freight forwarder (Medical):   Marland Kitchen Lack of Transportation (Non-Medical):   Physical Activity:   .  Days of Exercise per Week:   . Minutes of Exercise per Session:   Stress:   . Feeling of Stress :   Social Connections:   . Frequency of Communication with Friends and Family:   . Frequency of Social Gatherings with Friends and Family:   . Attends Religious Services:   . Active Member of Clubs or Organizations:   . Attends Banker Meetings:   Marland Kitchen Marital Status:      Family History: The patient's family history includes Cancer in his maternal grandfather; Colon cancer in his sister.  ROS:   Please see the history of present illness.    Denies any syncope bleeding orthopnea.  Has had some mild dizziness in midday all other systems reviewed and are negative.  EKGs/Labs/Other Studies Reviewed:    The following studies were reviewed today: Prior echocardiogram reviewed-normal EF aortic valve sclerosis  EKG:  EKG is  ordered today.  The  ekg ordered today demonstrates sinus rhythm 78 with ventricular bigeminy pattern.  Recent Labs: No results found for requested labs within last 8760 hours.  Recent Lipid Panel No results found for: CHOL, TRIG, HDL, CHOLHDL, VLDL, LDLCALC, LDLDIRECT  Physical Exam:    VS:  BP 128/80   Pulse 78   Ht 6\' 3"  (1.905 m)   Wt 206 lb (93.4 kg)   SpO2 98%   BMI 25.75 kg/m     Wt Readings from Last 3 Encounters:  06/03/19 206 lb (93.4 kg)  03/02/18 222 lb 1.9 oz (100.8 kg)  01/23/18 221 lb 3.2 oz (100.3 kg)     GEN:  Well nourished, well developed in no acute distress HEENT: Normal NECK: No JVD; No carotid bruits LYMPHATICS: No lymphadenopathy CARDIAC: Ventricular bigeminy ectopy noted , soft systolic murmurs, rubs, gallops RESPIRATORY:  Clear to auscultation without rales, wheezing or rhonchi  ABDOMEN: Soft, non-tender, non-distended MUSCULOSKELETAL:  No edema; No deformity  SKIN: Warm and dry NEUROLOGIC:  Alert and oriented x 3 PSYCHIATRIC:  Normal affect   ASSESSMENT:    1. PVC (premature ventricular contraction)   2. Factor V Leiden (HCC)    PLAN:    In order of problems listed above:  Ventricular bigeminy/PVCs -Previously one isolated PVC was noted on his prior EKG.  Ventricular bigeminy was picked up today.  I think would be reasonable to continue with conservative management.  His echocardiogram showed normal pump function.  There is no evidence of cardiomyopathy is related to PVCs.  Echocardiogram was reviewed from 04/03/2018.  Mild aortic sclerosis also noted.  If necessary in the future, we can always try to utilize flecainide or beta-blocker for instance to help suppress the PVCs.  Hypertension  - reduced losartan.   - mid morning dizziness. ? Sugar issue. Does not think it is.    -We will go ahead and reduce his losartan once again to 25 mg once a day.  He will continue to monitor his blood pressures closely with his cuff.  Hopefully this may help if his blood  pressures are getting too low, causing dizziness.  Factor V Leiden deficiency, recurrent venous thrombosis -Now on Eliquis.  Doing well.  Continue to monitor hemoglobin and creatinine.  Prior creatinine 1.15, hemoglobin 15.4, LDL 94   Medication Adjustments/Labs and Tests Ordered: Current medicines are reviewed at length with the patient today.  Concerns regarding medicines are outlined above.  No orders of the defined types were placed in this encounter.  Meds ordered this encounter  Medications  .  losartan (COZAAR) 25 MG tablet    Sig: Take 1 tablet (25 mg total) by mouth daily.    Dispense:  90 tablet    Refill:  3    Patient Instructions  Medication Instructions:  Please decrease Losartan to 25 mg a day. Continue all other medications as listed.  *If you need a refill on your cardiac medications before your next appointment, please call your pharmacy*  Follow-Up: At [facility], you and your health needs are our priority.  As part of our continuing mission to provide you with exceptional heart care, we have created designated Provider Care Teams.  These Care Teams include your primary Cardiologist (physician) and Advanced Practice Providers (APPs -  Physician Assistants and Nurse Practitioners) who all work together to provide you with the care you need, when you need it.  We recommend signing up for the patient portal called "MyChart".  Sign up information is provided on this After Visit Summary.  MyChart is used to connect with patients for Virtual Visits (Telemedicine).  Patients are able to view lab/test results, encounter notes, upcoming appointments, etc.  Non-urgent messages can be sent to your provider as well.   To learn more about what you can do with MyChart, go to [email or web address].    Your next appointment:   6 month(s)  The format for your next appointment:   In Person  Provider:   Candee Furbish, MD   Thank you for choosing [facility]!!         Signed, Candee Furbish, MD  06/03/2019 12:04 PM    [place]

## 2019-06-03 NOTE — Patient Instructions (Signed)
Medication Instructions:  Please decrease Losartan to 25 mg a day. Continue all other medications as listed.  *If you need a refill on your cardiac medications before your next appointment, please call your pharmacy*  Follow-Up: At [facility], you and your health needs are our priority.  As part of our continuing mission to provide you with exceptional heart care, we have created designated Provider Care Teams.  These Care Teams include your primary Cardiologist (physician) and Advanced Practice Providers (APPs -  Physician Assistants and Nurse Practitioners) who all work together to provide you with the care you need, when you need it.  We recommend signing up for the patient portal called "MyChart".  Sign up information is provided on this After Visit Summary.  MyChart is used to connect with patients for Virtual Visits (Telemedicine).  Patients are able to view lab/test results, encounter notes, upcoming appointments, etc.  Non-urgent messages can be sent to your provider as well.   To learn more about what you can do with MyChart, go to [email or web address].    Your next appointment:   6 month(s)  The format for your next appointment:   In Person  Provider:   Donato Schultz, MD   Thank you for choosing [facility]!!

## 2019-06-03 NOTE — Addendum Note (Signed)
Addended by: Sharin Grave on: 06/03/2019 12:07 PM   Modules accepted: Orders

## 2019-06-07 NOTE — Addendum Note (Signed)
Addended by: Louanne Belton, Aijalon Kirtz A on: 06/07/2019 10:41 AM   Modules accepted: Orders

## 2019-06-14 NOTE — Addendum Note (Signed)
Addended by: Jake Bathe on: 06/14/2019 08:47 AM   Modules accepted: Orders

## 2019-06-29 ENCOUNTER — Ambulatory Visit: Payer: BC Managed Care – PPO | Attending: Internal Medicine

## 2019-06-29 DIAGNOSIS — Z23 Encounter for immunization: Secondary | ICD-10-CM

## 2019-06-29 NOTE — Progress Notes (Signed)
   Covid-19 Vaccination Clinic  Name:  Todd Gardner    MRN: [id number] DOB: 1955/07/20  06/29/2019  [name] was observed post Covid-19 immunization for 15 minutes without incident. He was provided with Vaccine Information Sheet and instruction to access the V-Safe system.   [name] was instructed to call 911 with any severe reactions post vaccine: Marland Kitchen Difficulty breathing  . Swelling of face and throat  . A fast heartbeat  . A bad rash all over body  . Dizziness and weakness   Immunizations Administered    Name Date Dose VIS Date Route   Pfizer COVID-19 Vaccine 06/29/2019 11:08 AM 0.3 mL 02/26/2019 Intramuscular   Manufacturer: [employer], [employer]   Lot: [id number]   NDC: [address]-3

## 2019-07-12 ENCOUNTER — Ambulatory Visit: Payer: Self-pay | Admitting: Surgery

## 2019-07-12 NOTE — H&P (Signed)
MCKENNON ZWART Appointment: 07/12/2019 11:30 AM Location: [facility] Patient #: [id number] DOB: June 25, 1955 Married / Language: [place] / Race: White Male  History of Present Illness Ardeth Sportsman MD; 07/12/2019 1:19 PM) The patient is a 64 year old male who presents with a complaint of Rectal bleeding. Note for "Rectal bleeding": ` ` ` Patient sent for surgical consultation  Chief Complaint: Rectal bleeding. Concern for worsening hemorrhoid ` ` The patient is a gentleman that is struggled with rectal bleeding and presumed hemorrhoid problems for many years. Was seen in 2018. Hemorrhoids do not seem that severe. Fiber bowel regimen recommended. Dr. is a by retired. Had worsening episodes of rectal bleeding. Dr. Maisie Fus with our group over a year ago. She offered banding. He delayed given the Covid pandemic. Worsening symptoms. Wished to be reevaluated. Wished a second opinion. Was hoping just for banding. Initially to be sent to Dr. Cliffton Asters but then transferred to me since Dr. Cliffton Asters does not debanding. Patient somewhat frustrated with the last minute change but seems fine now.  Patient is anticoagulated on Eliquis. 3 pulmonary embolisms and found to have factor V Leiden deficiency. Followed by Dr. Percell Boston. Some hypertension but controlled. Usually moves his bowels every day or so. Intermittently uses a fiber supplement. Can walk a half hour without difficulty. No prior abdominal surgery. Gets up to 90 urinate once a night. No major changes. Because of worsening hemorrhoids, he wished to see if he can be banded to get them under control. Patient is followed by Dr. Matthias Hughs with [facility] gastroenterology. Has had colonoscopies in the past. Last 1 2017 with 1 polyp. Five-year follow-up recommended. Since that time, patient's father and sister were diagnosed with colorectal cancer. Patient is thinking about doing a colonoscopy this year, a year sooner.   No  personal nor family history of inflammatory bowel disease, irritable bowel syndrome, allergy such as Celiac Sprue, dietary/dairy problems, colitis, ulcers nor gastritis. No recent sick contacts/gastroenteritis. No recent travel outside the country (originally from the [place]). No changes in diet. No dysphagia to solids or liquids. No significant heartburn or reflux. No melena, hematemesis, coffee ground emesis. No evidence of prior gastric/peptic ulceration.  (Review of systems as stated in this history (HPI) or in the review of systems. Otherwise all other 12 point ROS are negative) ` ` `  This patient encounter took 55 minutes today to perform the following: obtain history, perform exam, review outside records, interpret tests & imaging, counsel the patient on their diagnosis; and, document this encounter, including findings & plan in the electronic health record (EHR).   Allergies (Tanisha A. Manson Passey, RMA; 07/12/2019 12:00 PM) EPINEPHrine *VASOPRESSORS* Lanolin *PHARMACEUTICAL ADJUVANTS* Lidocaine *CHEMICALS* Allergies Reconciled  Medication History (Tanisha A. Manson Passey, RMA; 07/12/2019 12:01 PM) Eliquis (5MG  Tablet, Oral) Active. Multivitamin (Oral) Active. Claritin (10MG  Tablet, Oral) Active. Losartan Potassium (25MG  Tablet, Oral) Active. Medications Reconciled    Vitals (Tanisha A. Brown RMA; 07/12/2019 12:01 PM) 07/12/2019 12:01 PM Weight: 205.8 lb Height: 75in Body Surface Area: 2.22 m Body Mass Index: 25.72 kg/m  Temp.: 98.76F  Pulse: 112 (Regular)  BP: 126/72(Sitting, Left Arm, Standard)        Physical Exam MD; 07/12/2019 1:01 PM)  General Mental Status-Alert. General Appearance-Not in acute distress, Not Sickly. Orientation-Oriented X3. Hydration-Well hydrated. Voice-Normal.  Integumentary Global Assessment Upon inspection and palpation of skin surfaces of the - Axillae: non-tender, no inflammation or ulceration,  no drainage. and Distribution of scalp and body hair is normal.  General Characteristics Temperature - normal warmth is noted.  Head and Neck Head-normocephalic, atraumatic with no lesions or palpable masses. Face Global Assessment - atraumatic, no absence of expression. Neck Global Assessment - no abnormal movements, no bruit auscultated on the right, no bruit auscultated on the left, no decreased range of motion, non-tender. Trachea-midline. Thyroid Gland Characteristics - non-tender. Note: Wears glasses. Vision corrected  Eye Eyeball - Left-Extraocular movements intact, No Nystagmus - Left. Eyeball - Right-Extraocular movements intact, No Nystagmus - Right. Cornea - Left-No Hazy - Left. Cornea - Right-No Hazy - Right. Sclera/Conjunctiva - Left-No scleral icterus, No Discharge - Left. Sclera/Conjunctiva - Right-No scleral icterus, No Discharge - Right. Pupil - Left-Direct reaction to light normal. Pupil - Right-Direct reaction to light normal.  ENMT Ears Pinna - Left - no drainage observed, no generalized tenderness observed. Pinna - Right - no drainage observed, no generalized tenderness observed. Nose and Sinuses External Inspection of the Nose - no destructive lesion observed. Inspection of the nares - Left - quiet respiration. Inspection of the nares - Right - quiet respiration. Mouth and Throat Lips - Upper Lip - no fissures observed, no pallor noted. Lower Lip - no fissures observed, no pallor noted. Nasopharynx - no discharge present. Oral Cavity/Oropharynx - Tongue - no dryness observed. Oral Mucosa - no cyanosis observed. Hypopharynx - no evidence of airway distress observed.  Chest and Lung Exam Inspection Movements - Normal and Symmetrical. Accessory muscles - No use of accessory muscles in breathing. Palpation Palpation of the chest reveals - Non-tender. Auscultation Breath sounds - Normal and Clear.  Cardiovascular Auscultation Rhythm -  Regular. Murmurs & Other Heart Sounds - Auscultation of the heart reveals - No Murmurs and No Systolic Clicks.  Abdomen Inspection Inspection of the abdomen reveals - No Visible peristalsis and No Abnormal pulsations. Umbilicus - No Bleeding, No Urine drainage. Palpation/Percussion Palpation and Percussion of the abdomen reveal - Soft, Non Tender, No Rebound tenderness, No Rigidity (guarding) and No Cutaneous hyperesthesia. Note: Abdomen soft. Nontender. Not distended. No umbilical or incisional hernias. No guarding.  Male Genitourinary Sexual Maturity Tanner 5 - Adult hair pattern and Adult penile size and shape. Note: No inguinal hernias. Normal external genitalia. Epididymi, testes, and spermatic cords normal without any masses.  Rectal Note: Obvious right sided prolapsed hemorrhoid with inflammation and sensitivity. No necrosis or thrombosis. Increased sphincter tone. Very sensitive. No obvious fissure or abscess. No pilonidal disease. Mild moister but no major pruritus. Held off on internal exam  Peripheral Vascular Upper Extremity Inspection - Left - No Cyanotic nailbeds - Left, Not Ischemic. Inspection - Right - No Cyanotic nailbeds - Right, Not Ischemic.  Neurologic Neurologic evaluation reveals -normal attention span and ability to concentrate, able to name objects and repeat phrases. Appropriate fund of knowledge , normal sensation and normal coordination. Mental Status Affect - not angry, not paranoid. Cranial Nerves-Normal Bilaterally. Gait-Normal.  Neuropsychiatric Mental status exam performed with findings of-able to articulate well with normal speech/language, rate, volume and coherence, thought content normal with ability to perform basic computations and apply abstract reasoning and no evidence of hallucinations, delusions, obsessions or homicidal/suicidal ideation.  Musculoskeletal Global Assessment Spine, Ribs and Pelvis - no instability,  subluxation or laxity. Right Upper Extremity - no instability, subluxation or laxity.  Lymphatic Head & Neck  General Head & Neck Lymphatics: Bilateral - Description - No Localized lymphadenopathy. Axillary  General Axillary Region: Bilateral - Description - No Localized lymphadenopathy. Femoral & Inguinal  Generalized Femoral & Inguinal Lymphatics:  Left - Description - No Localized lymphadenopathy. Right - Description - No Localized lymphadenopathy.    Assessment & Plan Ardeth Sportsman MD; 07/12/2019 12:56 PM)  PROLAPSED INTERNAL HEMORRHOIDS, GRADE 4 (K64.3) Impression: Obviously prolapsed hemorrhoid. Thrombosis or abscess. Very sensitive. I think this is the most obvious expiration for his rectal bleeding and discomfort.  I think because it is now chronically prolapsed, banding would not be appropriate. He is too sensitive for that anyway.  I recommended examination anesthesia with hemorrhoidal ligation/pexy. Probable hemorrhoidectomy of the dominant pile. Lousily make sure there are no other issues. Hopefully should be an outpatient surgery. He is interested in proceeding.  Like to make sure it safe to come off his anticoagulation for a couple days. We'll recheck his cardiologist, Dr. Jayme Cloud, to make sure that that is okay.  He's been followed by Dr. Jeannetta Ellis with gastroenterology. 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 However, both his sister and father were diagnosed with colorectal cancer. Passed away from it. I think it makes sense to reach out to Dr. Jeannetta Ellis and see if he should get scoped later this year. I would wait at least 3 months after hemorrhoid surgery before proceeding.  Gave plenty of time to explain the pathophysiology & differential diagnosis. We discussed my recommendations. He seemed comfortable with this plan. I gave him a chance to ask questions and answered them. He expressed understanding and appreciation. He agrees with my recommendations and  we will work to proceed with examination under anesthesia and hemorrhoid surgery  Current Plans Pt Education - [facility] Hemorrhoids ([place]): discussed with patient and provided information.  BLEEDING INTERNAL HEMORRHOIDS (K64.8)   ENCOUNTER FOR PREOPERATIVE EXAMINATION FOR GENERAL SURGICAL PROCEDURE (Z01.818)  Current Plans You are being scheduled for surgery- Our schedulers will call you.  You should hear from our office's scheduling department within 5 working days about the location, date, and time of surgery. We try to make accommodations for patient's preferences in scheduling surgery, but sometimes the OR schedule or the surgeon's schedule prevents [place] from making those accommodations.  If you have not heard from our office [phone]) in 5 working days, call the office and ask for your surgeon's nurse.  If you have other questions about your diagnosis, plan, or surgery, call the office and ask for your surgeon's nurse.  The anatomy & physiology of the anorectal region was discussed. The pathophysiology of hemorrhoids and differential diagnosis was discussed. Natural history risks without surgery was discussed. I stressed the importance of a bowel regimen to have daily soft bowel movements to minimize progression of disease. Interventions such as sclerotherapy & banding were discussed.  The patient's symptoms are not adequately controlled by medicines and other non-operative treatments. I feel the risks & problems of no surgery outweigh the operative risks; therefore, I recommended surgery to treat the hemorrhoids by ligation, pexy, and possible resection.  Risks such as bleeding, infection, urinary difficulties, need for further treatment, heart attack, death, and other risks were discussed. I noted a good likelihood this will help address the problem. Goals of post-operative recovery were discussed as well. Possibility that this will not correct all symptoms was explained.  Post-operative pain, bleeding, constipation, and other problems after surgery were discussed. We will work to minimize complications. Educational handouts further explaining the pathology, treatment options, and bowel regimen were given as well. Questions were answered. The patient expresses understanding & wishes to proceed with surgery.  Pt Education - [facility] Rectal Prep for Anorectal outpatient/office surgery: discussed  with patient and provided information. Pt Education - [facility] Rectal Surgery HCI (Aarib Pulido): discussed with patient and provided information.  HOMOZYGOUS FACTOR V LEIDEN MUTATION (D68.51)   HISTORY OF PULMONARY EMBOLISM (Z86.711)  Current Plans I recommended obtaining preoperative cardiac clearance for recommendations on management of anticoagualtion perioperatively:  1. Timing of holding anticoagulation 2. Need for any bridge therapy (SQ enoxaparin, IV heparin, IV Aggrastat, etc) preop/postop. 3. Desired timing of resumption of anticoagulation.  In general from Dr. Johney Maine' standpoint:  Aspirin is okay to continue perioperatively (81mg  or 325mg ) & does not need to be held  Hold warfarin 5 dayspreoperatively. Consider PT/INR level on arrival to short stay the day of surgery. No need to check PT/INR level on preop visit  Hold P2Y12 inibitors such as clopidrogel (Plavix) 4 dayspreoperatively  Hold direct thrombin / factor Xa inhibitors (Xerelto, Pradaxa, Eliquis, etc) 2 dayspreoperatively   Request clearance by cardiology to better assess operative risk & see if a reevaluation, further workup, etc is needed. Also recommendations on how medications such as for anticoagulation and blood pressure should be managed/held/restarted after surgery.   FAMILY HISTORY OF COLORECTAL CANCER (Z80.0)  Current Plans Instructed to schedule colonoscopywith a gastroenterologist  Adin Hector, MD, FACS, MASCRS Gastrointestinal and Minimally Invasive  Surgery  [facility] [address], [place], [place] [address] [phone] Main / Paging [phone] Fax

## 2019-07-13 ENCOUNTER — Telehealth: Payer: Self-pay | Admitting: *Deleted

## 2019-07-13 NOTE — Telephone Encounter (Signed)
Patient with diagnosis of PE/factor V leiden deficiency on Eliquis for anticoagulation.    Procedure: HEMORRHOIDECTOMY   Date of procedure: TBD  Patient Eliquis is being managed by PCP, Dr. Azucena Cecil. I will defer length of hold to Dr. Azucena Cecil  Clearance faxed to his office

## 2019-07-13 NOTE — Telephone Encounter (Signed)
   [facility] Pre-operative Risk Assessment    Request for surgical clearance:  1. What type of surgery is being performed? HEMORRHOIDECTOMY    2. When is this surgery scheduled? TBD   3. What type of clearance is required (medical clearance vs. Pharmacy clearance to hold med vs. Both)? BOTH  4. Are there any medications that need to be held prior to surgery and how long? ELIQUIS   5. Practice name and name of physician performing surgery? [facility]; DR. Remo Lipps GROSS   6. What is your office phone number [phone]    7.   What is your office fax number [phone]  8.   Anesthesia type (None, local, MAC, general) ? GENERAL   Todd Gardner 07/13/2019, 9:18 AM  _________________________________________________________________   (provider comments below)

## 2019-07-14 NOTE — Telephone Encounter (Signed)
   Primary Cardiologist: Donato Schultz, MD  Chart reviewed and patient contacted by phone today as part of pre-operative protocol coverage. Given past medical history and time since last visit, based on ACC/AHA guidelines, Todd Gardner would be at acceptable risk for the planned procedure without further cardiovascular testing.   Eliquis is managed by Dr Azucena Cecil and I asked the patient to contact his office about holding this pre op.   I will route this recommendation to the requesting party via Epic fax function and remove from pre-op pool.  Please call with questions.  Corine Shelter, PA-C 07/14/2019, 10:28 AM

## 2019-08-24 NOTE — Patient Instructions (Addendum)
DUE TO COVID-19 ONLY ONE VISITOR IS ALLOWED TO COME WITH YOU AND STAY IN THE WAITING ROOM ONLY DURING PRE OP AND PROCEDURE DAY OF SURGERY. THE 1 VISITOR MAY VISIT WITH YOU AFTER SURGERY IN YOUR PRIVATE ROOM DURING VISITING HOURS ONLY!  YOU NEED TO HAVE A COVID 19 TEST ON:08/30/19  @ 10:00 am , THIS TEST MUST BE DONE BEFORE SURGERY, COME  [place], [place]  , [address].  ([place]) ONCE YOUR COVID TEST IS COMPLETED, PLEASE BEGIN THE QUARANTINE INSTRUCTIONS AS OUTLINED IN YOUR HANDOUT.                Todd Gardner    Your procedure is scheduled on: 09/02/19   Report to [facility] Main  Entrance   Report to admitting at: 11:00 AM     Call this number if you have problems the morning of surgery [phone]    Remember: Do not eat solid food :After Midnight. Clear liquid diet from midnight until 10:00 am    CLEAR LIQUID DIET   Foods Allowed                                                                     Foods Excluded  Coffee and tea, regular and decaf                             liquids that you cannot  Plain Jell-O any favor except red or purple                                           see through such as: Fruit ices (not with fruit pulp)                                     milk, soups, orange juice  Iced Popsicles                                    All solid food Carbonated beverages, regular and diet                                    Cranberry, grape and apple juices Sports drinks like Gatorade Lightly seasoned clear broth or consume(fat free) Sugar, honey syrup  Sample Menu Breakfast                                Lunch                                     Supper Cranberry juice                    Beef broth  Chicken broth Jell-O                                     Grape juice                           Apple juice Coffee or tea                        Jell-O                                      Popsicle                                                 Coffee or tea                        Coffee or tea  _____________________________________________________________________  BRUSH YOUR TEETH MORNING OF SURGERY AND RINSE YOUR MOUTH OUT, NO CHEWING GUM CANDY OR MINTS.     Take these medicines the morning of surgery with A SIP OF WATER: loratadine.                                 You may not have any metal on your body including hair pins and              piercings  Do not wear jewelry, lotions, powders or perfumes, deodorant             Men may shave face and neck.   Do not bring valuables to the hospital. [facility] IS NOT             RESPONSIBLE   FOR VALUABLES.  Contacts, dentures or bridgework may not be worn into surgery.  Leave suitcase in the car. After surgery it may be brought to your room.     Patients discharged the day of surgery will not be allowed to drive home. IF YOU ARE HAVING SURGERY AND GOING HOME THE SAME DAY, YOU MUST HAVE AN ADULT TO DRIVE YOU HOME AND BE WITH YOU FOR 24 HOURS. YOU MAY GO HOME BY TAXI OR UBER OR ORTHERWISE, BUT AN ADULT MUST ACCOMPANY YOU HOME AND STAY WITH YOU FOR 24 HOURS.  Name and phone number of your driver:  Special Instructions: N/A              Please read over the following fact sheets you were given: _____________________________________________________________________  [facility] - Preparing for Surgery Before surgery, you can play an important role.  Because skin is not sterile, your skin needs to be as free of germs as possible.  You can reduce the number of germs on your skin by washing with CHG (chlorahexidine gluconate) soap before surgery.  CHG is an antiseptic cleaner which kills germs and bonds with the skin to continue killing germs even after washing. Please DO NOT use if you have an allergy to CHG or antibacterial soaps.  If your skin becomes reddened/irritated stop using the CHG and inform your nurse when you arrive at  Short Stay. Do  not shave (including legs and underarms) for at least 48 hours prior to the first CHG shower.  You may shave your face/neck. Please follow these instructions carefully:  1.  Shower with CHG Soap the night before surgery and the  morning of Surgery.  2.  If you choose to wash your hair, wash your hair first as usual with your  normal  shampoo.  3.  After you shampoo, rinse your hair and body thoroughly to remove the  shampoo.                           4.  Use CHG as you would any other liquid soap.  You can apply chg directly  to the skin and wash                       Gently with a scrungie or clean washcloth.  5.  Apply the CHG Soap to your body ONLY FROM THE NECK DOWN.   Do not use on face/ open                           Wound or open sores. Avoid contact with eyes, ears mouth and genitals (private parts).                       Wash face,  Genitals (private parts) with your normal soap.             6.  Wash thoroughly, paying special attention to the area where your surgery  will be performed.  7.  Thoroughly rinse your body with warm water from the neck down.  8.  DO NOT shower/wash with your normal soap after using and rinsing off  the CHG Soap.                9.  Pat yourself dry with a clean towel.            10.  Wear clean pajamas.            11.  Place clean sheets on your bed the night of your first shower and do not  sleep with pets. Day of Surgery : Do not apply any lotions/deodorants the morning of surgery.  Please wear clean clothes to the hospital/surgery center.  FAILURE TO FOLLOW THESE INSTRUCTIONS MAY RESULT IN THE CANCELLATION OF YOUR SURGERY PATIENT SIGNATURE_________________________________  NURSE SIGNATURE__________________________________  ________________________________________________________________________

## 2019-08-25 ENCOUNTER — Encounter (HOSPITAL_COMMUNITY): Payer: Self-pay

## 2019-08-25 ENCOUNTER — Encounter (HOSPITAL_COMMUNITY)
Admission: RE | Admit: 2019-08-25 | Discharge: 2019-08-25 | Disposition: A | Discharge status: Home / Self Care | Payer: BC Managed Care – PPO | Source: Ambulatory Visit | Attending: Surgery | Admitting: Surgery

## 2019-08-25 ENCOUNTER — Other Ambulatory Visit: Payer: Self-pay

## 2019-08-25 ENCOUNTER — Encounter (INDEPENDENT_AMBULATORY_CARE_PROVIDER_SITE_OTHER): Payer: Self-pay

## 2019-08-25 DIAGNOSIS — Z01812 Encounter for preprocedural laboratory examination: Secondary | ICD-10-CM | POA: Diagnosis not present

## 2019-08-25 HISTORY — DX: Cardiac arrhythmia, unspecified: I49.9

## 2019-08-25 HISTORY — DX: Essential (primary) hypertension: I10

## 2019-08-25 LAB — BASIC METABOLIC PANEL
Anion gap: 8 (ref 5–15)
BUN: 11 mg/dL (ref 8–23)
CO2: 30 mmol/L (ref 22–32)
Calcium: 9.8 mg/dL (ref 8.9–10.3)
Chloride: 100 mmol/L (ref 98–111)
Creatinine, Ser: 0.89 mg/dL (ref 0.61–1.24)
GFR calc Af Amer: >60 mL/min (ref >60)
GFR calc non Af Amer: >60 mL/min (ref >60)
Glucose, Bld: 112 mg/dL — ABNORMAL HIGH (ref 70–99)
Potassium: 4.4 mmol/L (ref 3.5–5.1)
Sodium: 138 mmol/L (ref 135–145)

## 2019-08-25 LAB — CBC
HCT: 48.5 % (ref 39.0–52.0)
Hemoglobin: 16.3 g/dL (ref 13.0–17.0)
MCH: 33.8 pg (ref 26.0–34.0)
MCHC: 33.6 g/dL (ref 30.0–36.0)
MCV: 100.6 fL — ABNORMAL HIGH (ref 80.0–100.0)
Platelets: 240 10*3/uL (ref 150–400)
RBC: 4.82 MIL/uL (ref 4.22–5.81)
RDW: 12.3 % (ref 11.5–15.5)
WBC: 9.5 10*3/uL (ref 4.0–10.5)
nRBC: 0 % (ref 0.0–0.2)

## 2019-08-25 NOTE — Progress Notes (Signed)
As per pt's request,lab results were faxed to PCP: Dr. Tally Joe. Fax number [phone]. Also some pt's request about the type of anesthesia that he prefers, and the anesthesiologist from [facility] preference were informed to [facility].

## 2019-08-25 NOTE — Progress Notes (Signed)
Anesthesia Chart Review   Case: [id number] Date/Time: 09/02/19 1247   Procedures:      HEMORRHOIDECTOMY, HEMORRHOIDAL LIGATION/PEXY (N/A )     ANORECTAL EXAM UNDER ANESTHESIA (N/A )   Anesthesia type: General   Pre-op diagnosis: HEMORRHOIDS PROLAPSED GRADE 4 WITH BLEEDING AND WITH PAIN   Location: [facility] ROOM 05 / [facility]   Surgeons: Michael Boston, MD      DISCUSSION:64 y.o. never smoker with h/o HTN, PVCs, Factor V Leiden, PE, prolapsed hemorrhoids Grade 4 with bleeding scheduled for above procedure 09/02/2019 with Dr. Michael Boston.   Per cardiology pre-operative risk assessment 07/14/2019, "Chart reviewed and patient contacted by phone today as part of pre-operative protocol coverage. Given past medical history and time since last visit, based on ACC/AHA guidelines, Todd Gardner would be at acceptable risk for the planned procedure without further cardiovascular testing.  Eliquis is managed by Dr Moreen Fowler and I asked the patient to contact his office about holding this pre op."  Pt advised by PCP to hold Eliquis 2 days prior to procedure.  VS: There were no vitals taken for this visit.  PROVIDERS: Antony Contras, MD is PCP   Candee Furbish, MD is Cardiologist  LABS: Labs reviewed: Acceptable for surgery. (all labs ordered are listed, but only abnormal results are displayed)  Labs Reviewed  BASIC METABOLIC PANEL - Abnormal; Notable for the following components:      Result Value   Glucose, Bld 112 (*)    All other components within normal limits  CBC - Abnormal; Notable for the following components:   MCV 100.6 (*)    All other components within normal limits     IMAGES:   EKG: 06/03/2019 Rate 78 bpm  SR w/ ventricular bigeminy   CV: Echo 04/03/2018 Study Conclusions   - Left ventricle: The cavity size was normal. Systolic function was  normal. The estimated ejection fraction was in the range of 60%  to 65%. Wall motion was normal; there were no regional wall  motion  abnormalities. Left ventricular diastolic function  parameters were normal.  - Aortic valve: Sclerosis without stenosis.  - Atrial septum: No defect or patent foramen ovale was identified.  Past Medical History:  Diagnosis Date  . Clotting disorder ([place])   . Dysrhythmia    PVC's  . Factor V Leiden ([place])    positive  . Hemorrhoids   . Hernia   . History of blood clots    Pulmonary embolism  . Hypertension   . Jock itch 12-20-11   mild case at present  . Poor circulation   . Pulmonary embolism ([place]) 1997  . Ulcer    leg  . Wears glasses     Past Surgical History:  Procedure Laterality Date  . HAND SURGERY     Left  . INGUINAL HERNIA REPAIR  12/23/2011   Procedure: HERNIA REPAIR INGUINAL ADULT;  Surgeon: Odis Hollingshead, MD;  Location: [facility];  Service: General;  Laterality: Left;  Left Inguinal Hernia with Mesh  . teeth extraction  01-01-2011  3 teeth extracted by oral surgeon  . vein ablation surgery  8/12 and 12/08  . vericose vein surgery  1997   R leg    MEDICATIONS: . ELIQUIS 5 MG TABS tablet  . loratadine (CLARITIN) 10 MG tablet  . losartan (COZAAR) 25 MG tablet  . Multiple Vitamins-Minerals (MULTIVITAMIN WITH MINERALS) tablet   No current facility-administered medications for this encounter.     Konrad Felix, PA-C [facility]  Pre-Surgical Testing [phone] 08/27/19  2:23 PM

## 2019-08-25 NOTE — Progress Notes (Addendum)
COVID Vaccine Completed:yes Date COVID Vaccine completed:06/29/19 COVID vaccine manufacturer: *Pf[employer]    [employer]   PCP - Dr. Tally Joe Cardiologist - Dr. Donato Schultz. LOV: 06/03/19. Clearance: [place] [facility]: 07/14/19. EPIC  Chest x-ray -  EKG - 06/03/19. EPIC Stress Test -  ECHO - 04/03/18 Cardiac Cath -   Sleep Study -  CPAP -   Fasting Blood Sugar -  Checks Blood Sugar _____ times a day  Blood Thinner Instructions: Eliquis: will be stop 2 days before surgery. Aspirin Instructions:Dr. Michaell Cowing Last Dose:  Anesthesia review: Hx: clotting disorder,factor V leiden,PVC's,PE,HTN.  Patient denies shortness of breath, fever, cough and chest pain at PAT appointment   Patient verbalized understanding of instructions that were given to them at the PAT appointment. Patient was also instructed that they will need to review over the PAT instructions again at home before surgery.

## 2019-08-30 ENCOUNTER — Other Ambulatory Visit (HOSPITAL_COMMUNITY)
Admission: RE | Admit: 2019-08-30 | Discharge: 2019-08-30 | Disposition: A | Discharge status: Home / Self Care | Payer: BC Managed Care – PPO | Source: Ambulatory Visit | Attending: Surgery | Admitting: Surgery

## 2019-08-30 DIAGNOSIS — Z20822 Contact with and (suspected) exposure to covid-19: Secondary | ICD-10-CM | POA: Diagnosis not present

## 2019-08-30 DIAGNOSIS — Z01812 Encounter for preprocedural laboratory examination: Secondary | ICD-10-CM | POA: Diagnosis not present

## 2019-08-30 LAB — SARS CORONAVIRUS 2 (TAT 6-24 HRS): SARS Coronavirus 2: NEGATIVE

## 2019-09-01 MED ORDER — BUPIVACAINE LIPOSOME 1.3 % IJ SUSP
20.0000 mL | Freq: Once | INTRAMUSCULAR | Status: DC
  Filled 2019-09-01: qty 20

## 2019-09-02 ENCOUNTER — Encounter (HOSPITAL_COMMUNITY): Payer: Self-pay | Admitting: Surgery

## 2019-09-02 ENCOUNTER — Ambulatory Visit (HOSPITAL_COMMUNITY)
Admission: RE | Admit: 2019-09-02 | Discharge: 2019-09-02 | Disposition: A | Discharge status: Home / Self Care | Payer: BC Managed Care – PPO | Attending: Surgery | Admitting: Surgery

## 2019-09-02 ENCOUNTER — Ambulatory Visit (HOSPITAL_COMMUNITY): Payer: BC Managed Care – PPO | Admitting: Physician Assistant

## 2019-09-02 ENCOUNTER — Encounter (HOSPITAL_COMMUNITY)
Admission: RE | Disposition: A | Discharge status: Home / Self Care | Payer: Self-pay | Source: Home / Self Care | Attending: Surgery

## 2019-09-02 ENCOUNTER — Ambulatory Visit (HOSPITAL_COMMUNITY): Payer: BC Managed Care – PPO | Admitting: Certified Registered"

## 2019-09-02 DIAGNOSIS — Z7902 Long term (current) use of antithrombotics/antiplatelets: Secondary | ICD-10-CM | POA: Insufficient documentation

## 2019-09-02 DIAGNOSIS — R233 Spontaneous ecchymoses: Secondary | ICD-10-CM

## 2019-09-02 DIAGNOSIS — Z79899 Other long term (current) drug therapy: Secondary | ICD-10-CM | POA: Diagnosis not present

## 2019-09-02 DIAGNOSIS — Z7901 Long term (current) use of anticoagulants: Secondary | ICD-10-CM | POA: Insufficient documentation

## 2019-09-02 DIAGNOSIS — I10 Essential (primary) hypertension: Secondary | ICD-10-CM | POA: Diagnosis not present

## 2019-09-02 DIAGNOSIS — D6851 Activated protein C resistance: Secondary | ICD-10-CM | POA: Diagnosis not present

## 2019-09-02 DIAGNOSIS — I2699 Other pulmonary embolism without acute cor pulmonale: Secondary | ICD-10-CM | POA: Insufficient documentation

## 2019-09-02 DIAGNOSIS — K643 Fourth degree hemorrhoids: Secondary | ICD-10-CM

## 2019-09-02 DIAGNOSIS — Z800 Family history of malignant neoplasm of digestive organs: Secondary | ICD-10-CM | POA: Insufficient documentation

## 2019-09-02 HISTORY — PX: RECTAL EXAM UNDER ANESTHESIA: SHX6399

## 2019-09-02 HISTORY — PX: HEMORRHOID SURGERY: SHX153

## 2019-09-02 SURGERY — HEMORRHOIDECTOMY
Anesthesia: General | Site: Rectum

## 2019-09-02 MED ORDER — ZINC OXIDE 40 % EX OINT
TOPICAL_OINTMENT | Freq: Once | CUTANEOUS | Status: AC
  Administered 2019-09-02: 1 via TOPICAL
  Filled 2019-09-02: qty 57

## 2019-09-02 MED ORDER — LIDOCAINE 2% (20 MG/ML) 5 ML SYRINGE
INTRAMUSCULAR | Status: DC | PRN
  Administered 2019-09-02: 100 mg via INTRAVENOUS

## 2019-09-02 MED ORDER — SODIUM CHLORIDE 0.9 % IV SOLN
2.0000 g | INTRAVENOUS | Status: AC
  Administered 2019-09-02: 2 g via INTRAVENOUS
  Filled 2019-09-02: qty 20

## 2019-09-02 MED ORDER — SUGAMMADEX SODIUM 200 MG/2ML IV SOLN
INTRAVENOUS | Status: DC | PRN
  Administered 2019-09-02: 200 mg via INTRAVENOUS

## 2019-09-02 MED ORDER — DIBUCAINE (PERIANAL) 1 % EX OINT
TOPICAL_OINTMENT | CUTANEOUS | Status: AC
  Filled 2019-09-02: qty 28

## 2019-09-02 MED ORDER — CELECOXIB 200 MG PO CAPS
400.0000 mg | ORAL_CAPSULE | ORAL | Status: AC
  Administered 2019-09-02: 400 mg via ORAL
  Filled 2019-09-02: qty 2

## 2019-09-02 MED ORDER — PROPOFOL 1000 MG/100ML IV EMUL
INTRAVENOUS | Status: AC
  Filled 2019-09-02: qty 100

## 2019-09-02 MED ORDER — GABAPENTIN 300 MG PO CAPS
300.0000 mg | ORAL_CAPSULE | ORAL | Status: AC
  Administered 2019-09-02: 300 mg via ORAL
  Filled 2019-09-02: qty 1

## 2019-09-02 MED ORDER — BUPIVACAINE HCL (PF) 0.25 % IJ SOLN
INTRAMUSCULAR | Status: DC | PRN
Start: 2019-09-02 — End: 2019-09-02
  Administered 2019-09-02: 20 mL

## 2019-09-02 MED ORDER — 0.9 % SODIUM CHLORIDE (POUR BTL) OPTIME
TOPICAL | Status: DC | PRN
  Administered 2019-09-02: 1000 mL

## 2019-09-02 MED ORDER — FENTANYL CITRATE (PF) 250 MCG/5ML IJ SOLN
INTRAMUSCULAR | Status: DC | PRN
  Administered 2019-09-02: 100 ug via INTRAVENOUS
  Administered 2019-09-02: 50 ug via INTRAVENOUS

## 2019-09-02 MED ORDER — FENTANYL CITRATE (PF) 100 MCG/2ML IJ SOLN
INTRAMUSCULAR | Status: AC
  Filled 2019-09-02: qty 2

## 2019-09-02 MED ORDER — MIDAZOLAM HCL 2 MG/2ML IJ SOLN
INTRAMUSCULAR | Status: DC | PRN
  Administered 2019-09-02: 2 mg via INTRAVENOUS

## 2019-09-02 MED ORDER — ORAL CARE MOUTH RINSE: 15.0000 mL | Freq: Once | OROMUCOSAL | Status: AC

## 2019-09-02 MED ORDER — FENTANYL CITRATE (PF) 100 MCG/2ML IJ SOLN
25.0000 ug | INTRAMUSCULAR | Status: DC | PRN
  Administered 2019-09-02: 25 ug via INTRAVENOUS

## 2019-09-02 MED ORDER — PROPOFOL 500 MG/50ML IV EMUL
INTRAVENOUS | Status: DC | PRN
Start: 2019-09-02 — End: 2019-09-02
  Administered 2019-09-02: 150 ug/kg/min via INTRAVENOUS

## 2019-09-02 MED ORDER — BUPIVACAINE HCL 0.25 % IJ SOLN
INTRAMUSCULAR | Status: AC
  Filled 2019-09-02: qty 1

## 2019-09-02 MED ORDER — ONDANSETRON HCL 4 MG/2ML IJ SOLN
INTRAMUSCULAR | Status: AC
  Filled 2019-09-02: qty 2

## 2019-09-02 MED ORDER — DEXAMETHASONE SODIUM PHOSPHATE 10 MG/ML IJ SOLN
INTRAMUSCULAR | Status: DC | PRN
  Administered 2019-09-02: 8 mg via INTRAVENOUS

## 2019-09-02 MED ORDER — METRONIDAZOLE IN NACL 5-0.79 MG/ML-% IV SOLN
500.0000 mg | INTRAVENOUS | Status: AC
  Administered 2019-09-02: 500 mg via INTRAVENOUS
  Filled 2019-09-02: qty 100

## 2019-09-02 MED ORDER — BUPIVACAINE LIPOSOME 1.3 % IJ SUSP
INTRAMUSCULAR | Status: DC | PRN
  Administered 2019-09-02: 20 mL

## 2019-09-02 MED ORDER — LACTATED RINGERS IV SOLN: INTRAVENOUS | Status: DC

## 2019-09-02 MED ORDER — CHLORHEXIDINE GLUCONATE CLOTH 2 % EX PADS: 6.0000 | MEDICATED_PAD | Freq: Once | CUTANEOUS | Status: DC

## 2019-09-02 MED ORDER — CHLORHEXIDINE GLUCONATE 0.12 % MT SOLN
15.0000 mL | Freq: Once | OROMUCOSAL | Status: AC
  Administered 2019-09-02: 15 mL via OROMUCOSAL

## 2019-09-02 MED ORDER — LABETALOL HCL 5 MG/ML IV SOLN: 5.0000 mg | Freq: Once | INTRAVENOUS | Status: DC

## 2019-09-02 MED ORDER — PROPOFOL 10 MG/ML IV BOLUS
INTRAVENOUS | Status: AC
  Filled 2019-09-02: qty 20

## 2019-09-02 MED ORDER — ROCURONIUM BROMIDE 10 MG/ML (PF) SYRINGE
PREFILLED_SYRINGE | INTRAVENOUS | Status: AC
  Filled 2019-09-02: qty 10

## 2019-09-02 MED ORDER — ACETAMINOPHEN 500 MG PO TABS
1000.0000 mg | ORAL_TABLET | ORAL | Status: AC
  Administered 2019-09-02: 1000 mg via ORAL
  Filled 2019-09-02: qty 2

## 2019-09-02 MED ORDER — LIDOCAINE 2% (20 MG/ML) 5 ML SYRINGE
INTRAMUSCULAR | Status: AC
  Filled 2019-09-02: qty 5

## 2019-09-02 MED ORDER — PROPOFOL 500 MG/50ML IV EMUL
INTRAVENOUS | Status: AC
  Filled 2019-09-02: qty 50

## 2019-09-02 MED ORDER — ROCURONIUM BROMIDE 10 MG/ML (PF) SYRINGE
PREFILLED_SYRINGE | INTRAVENOUS | Status: DC | PRN
  Administered 2019-09-02: 60 mg via INTRAVENOUS
  Administered 2019-09-02: 5 mg via INTRAVENOUS

## 2019-09-02 MED ORDER — MIDAZOLAM HCL 2 MG/2ML IJ SOLN
INTRAMUSCULAR | Status: AC
  Filled 2019-09-02: qty 2

## 2019-09-02 MED ORDER — ONDANSETRON HCL 4 MG/2ML IJ SOLN
INTRAMUSCULAR | Status: DC | PRN
  Administered 2019-09-02: 4 mg via INTRAVENOUS

## 2019-09-02 MED ORDER — LABETALOL HCL 5 MG/ML IV SOLN
INTRAVENOUS | Status: AC
  Administered 2019-09-02: 5 mg via INTRAVENOUS
  Filled 2019-09-02: qty 4

## 2019-09-02 MED ORDER — PROPOFOL 10 MG/ML IV BOLUS
INTRAVENOUS | Status: DC | PRN
  Administered 2019-09-02: 180 mg via INTRAVENOUS

## 2019-09-02 MED ORDER — OXYCODONE HCL 5 MG PO TABS: 5.0000 mg | ORAL_TABLET | Freq: Four times a day (QID) | ORAL | 0 refills | Status: AC | PRN

## 2019-09-02 MED ORDER — DEXAMETHASONE SODIUM PHOSPHATE 10 MG/ML IJ SOLN
INTRAMUSCULAR | Status: AC
  Filled 2019-09-02: qty 1

## 2019-09-02 SURGICAL SUPPLY — 36 items
APL SKNCLS STERI-STRIP NONHPOA (GAUZE/BANDAGES/DRESSINGS) ×2
BENZOIN TINCTURE PRP APPL 2/3 (GAUZE/BANDAGES/DRESSINGS) ×3 IMPLANT
BLADE SURG 15 STRL LF DISP TIS (BLADE) IMPLANT
BLADE SURG 15 STRL SS (BLADE)
BRIEF STRETCH FOR OB PAD LRG (UNDERPADS AND DIAPERS) ×3 IMPLANT
CNTNR URN SCR LID CUP LEK RST (MISCELLANEOUS) ×2 IMPLANT
CONT SPEC 4OZ STRL OR WHT (MISCELLANEOUS) ×3
COVER SURGICAL LIGHT HANDLE (MISCELLANEOUS) ×3 IMPLANT
COVER WAND RF STERILE (DRAPES) ×1 IMPLANT
DECANTER SPIKE VIAL GLASS SM (MISCELLANEOUS) ×3 IMPLANT
DRAPE LAPAROTOMY T 102X78X121 (DRAPES) ×3 IMPLANT
DRSG PAD ABDOMINAL 8X10 ST (GAUZE/BANDAGES/DRESSINGS) ×1 IMPLANT
ELECT REM PT RETURN 15FT ADLT (MISCELLANEOUS) ×3 IMPLANT
GAUZE 4X4 16PLY RFD (DISPOSABLE) ×3 IMPLANT
GAUZE SPONGE 4X4 12PLY STRL (GAUZE/BANDAGES/DRESSINGS) ×1 IMPLANT
GLOVE ECLIPSE 8.0 STRL XLNG CF (GLOVE) ×3 IMPLANT
GLOVE INDICATOR 8.0 STRL GRN (GLOVE) ×3 IMPLANT
GOWN STRL REUS W/TWL XL LVL3 (GOWN DISPOSABLE) ×6 IMPLANT
KIT BASIN (CUSTOM PROCEDURE TRAY) ×3 IMPLANT
KIT TURNOVER KIT A (KITS) IMPLANT
LOOP VESSEL MAXI BLUE (MISCELLANEOUS) IMPLANT
NEEDLE HYPO 22GX1.5 SAFETY (NEEDLE) ×3 IMPLANT
PACK BASIC VI WITH GOWN DISP (CUSTOM PROCEDURE TRAY) ×3 IMPLANT
PENCIL SMOKE EVACUATOR (MISCELLANEOUS) IMPLANT
SHEARS HARMONIC 9CM CVD (BLADE) IMPLANT
SURGILUBE 2OZ TUBE FLIPTOP (MISCELLANEOUS) ×3 IMPLANT
SUT CHROMIC 2 0 SH (SUTURE) ×2 IMPLANT
SUT CHROMIC 3 0 SH 27 (SUTURE) ×1 IMPLANT
SUT VIC AB 2-0 SH 27 (SUTURE)
SUT VIC AB 2-0 SH 27X BRD (SUTURE) IMPLANT
SUT VIC AB 2-0 UR6 27 (SUTURE) ×18 IMPLANT
SYR 20ML LL LF (SYRINGE) ×3 IMPLANT
SYR 3ML LL SCALE MARK (SYRINGE) IMPLANT
TOWEL OR 17X26 10 PK STRL BLUE (TOWEL DISPOSABLE) ×3 IMPLANT
TOWEL OR NON WOVEN STRL DISP B (DISPOSABLE) ×3 IMPLANT
YANKAUER SUCT BULB TIP 10FT TU (MISCELLANEOUS) ×3 IMPLANT

## 2019-09-02 NOTE — Anesthesia Postprocedure Evaluation (Signed)
Anesthesia Post Note  Patient: Todd Gardner  Procedure(s) Performed: HEMORRHOIDECTOMY, HEMORRHOIDAL LIGATION/PEXY (N/A Rectum) ANORECTAL EXAM UNDER ANESTHESIA (N/A )     Patient location during evaluation: PACU Anesthesia Type: General Level of consciousness: awake and alert Pain management: pain level controlled Vital Signs Assessment: post-procedure vital signs reviewed and stable Respiratory status: spontaneous breathing, nonlabored ventilation, respiratory function stable and patient connected to nasal cannula oxygen Cardiovascular status: blood pressure returned to baseline and stable Postop Assessment: no apparent nausea or vomiting Anesthetic complications: no   No complications documented.  Last Vitals:  Vitals:   09/02/19 1700 09/02/19 1710  BP: (!) 156/82 133/70  Pulse: 73 81  Resp: (!) 8 12  Temp:  36.6 C  SpO2: 96% 97%    Last Pain:  Vitals:   09/02/19 1710  TempSrc:   PainSc: 2                  Arie Gable L Cylus [place]

## 2019-09-02 NOTE — Interval H&P Note (Signed)
History and Physical Interval Note:  09/02/2019 12:10 PM  Todd Gardner  has presented today for surgery, with the diagnosis of HEMORRHOIDS PROLAPSED GRADE 4 WITH BLEEDING AND WITH PAIN.  The various methods of treatment have been discussed with the patient and family. After consideration of risks, benefits and other options for treatment, the patient has consented to  Procedure(s): HEMORRHOIDECTOMY, HEMORRHOIDAL LIGATION/PEXY (N/A) ANORECTAL EXAM UNDER ANESTHESIA (N/A) as a surgical intervention.  The patient's history has been reviewed, patient examined, no change in status, stable for surgery.  I have reviewed the patient's chart and labs.  Questions were answered to the patient's satisfaction.    I have re-reviewed the the patient's records, history, medications, and allergies.  I have re-examined the patient.  I again discussed intraoperative plans and goals of post-operative recovery.  The patient agrees to proceed.  Todd Gardner  1955-12-23 [id number]  Patient Care Team: Tally Joe, MD as PCP - General (Family Medicine) Jake Bathe, MD as PCP - Cardiology (Cardiology) Karie Soda, MD as Consulting Physician (General Surgery) Bernette Redbird, MD as Consulting Physician (Gastroenterology)  Patient Active Problem List   Diagnosis Date Noted   Allergic rhinitis 08/18/2017   Family history of colon cancer 08/18/2017   History of inguinal hernia 08/18/2017   Hyperlipidemia 08/18/2017   Long term current use of anticoagulant 08/18/2017   Personal history of pulmonary embolism 08/18/2017   Venous insufficiency 08/18/2017   Varicose veins of right lower extremity with complications 01/02/2016   Venous hypertension, chronic, with ulcer, right 01/02/2016   Cough 11/14/2010   Factor V Leiden ([facility])     Past Medical History:  Diagnosis Date   Clotting disorder ([facility])    Dysrhythmia    PVC's   Factor V Leiden ([facility])    positive   Hemorrhoids    Hernia    History of blood  clots    Pulmonary embolism   Hypertension    Jock itch 12-20-11   mild case at present   Poor circulation    Pulmonary embolism ([facility]) 1997   Ulcer    leg   Wears glasses     Past Surgical History:  Procedure Laterality Date   HAND SURGERY     Left   INGUINAL HERNIA REPAIR  12/23/2011   Procedure: HERNIA REPAIR INGUINAL ADULT;  Surgeon: Adolph Pollack, MD;  Location[facility];  Service: General;  Laterality: Left;  Left Inguinal Hernia with Mesh   teeth extraction  01-01-2011  3 teeth extracted by oral surgeon   vein ablation surgery  8/12 and 12/08   vericose vein surgery  1997   R leg    Social History   Socioeconomic History   Marital status: Married    Spouse name: Not on file   Number of children: Y   Years of education: Not on file   Highest education level: Not on file  Occupational History   Occupation: Firefighter: [employer]  Tobacco Use   Smoking status: Never Smoker   Smokeless tobacco: Never Used  Building services engineer Use: Never used  Substance and Sexual Activity   Alcohol use: Yes    Alcohol/week: 2.0 standard drinks    Types: 2 Cans of beer per week    Comment: 3 dks per week   Drug use: No   Sexual activity: Yes  Other Topics Concern   Not on file  Social History Narrative   Moved  here from [place].   Social Determinants of Health   Financial Resource Strain:    Difficulty of Paying Living Expenses:   Food Insecurity:    Worried About Charity fundraiser in the Last Year:    Arboriculturist in the Last Year:   Transportation Needs:    Film/video editor (Medical):    Lack of Transportation (Non-Medical):   Physical Activity:    Days of Exercise per Week:    Minutes of Exercise per Session:   Stress:    Feeling of Stress :   Social Connections:    Frequency of Communication with Friends and Family:    Frequency of Social Gatherings with Friends and Family:    Attends Religious Services:     Active Member of Clubs or Organizations:    Attends Music therapist:    Marital Status:   Intimate Partner Violence:    Fear of Current or Ex-Partner:    Emotionally Abused:    Physically Abused:    Sexually Abused:     Family History  Problem Relation Age of Onset   Colon cancer Sister    Cancer Maternal Grandfather        colon    Medications Prior to Admission  Medication Sig Dispense Refill Last Dose   ELIQUIS 5 MG TABS tablet Take 5 mg by mouth 2 (two) times daily.   08/30/2019   losartan (COZAAR) 25 MG tablet Take 1 tablet (25 mg total) by mouth daily. 90 tablet 3 09/01/2019 at Unknown time   Multiple Vitamins-Minerals (MULTIVITAMIN WITH MINERALS) tablet Take 1 tablet by mouth daily.   09/01/2019 at Unknown time   loratadine (CLARITIN) 10 MG tablet Take 10 mg by mouth daily as needed for allergies.    More than a month at Unknown time    Current Facility-Administered Medications  Medication Dose Route Frequency Provider Last Rate Last Admin   bupivacaine liposome (EXPAREL) 1.3 % injection 266 mg  20 mL Infiltration Once Michael Boston, MD       cefTRIAXone (ROCEPHIN) 2 g in sodium chloride 0.9 % 100 mL IVPB  2 g Intravenous On Call to OR Michael Boston, MD       And   metroNIDAZOLE (FLAGYL) IVPB 500 mg  500 mg Intravenous On Call to OR Michael Boston, MD       Chlorhexidine Gluconate Cloth 2 % PADS 6 each  6 each Topical Once Michael Boston, MD       And   Chlorhexidine Gluconate Cloth 2 % PADS 6 each  6 each Topical Once Michael Boston, MD       lactated ringers infusion   Intravenous Continuous Lidia Collum, MD 50 mL/hr at 09/02/19 1207 New Bag at 09/02/19 1207     Allergies  Allergen Reactions   Lidocaine Other (See Comments)    Topical Burning sensation     BP (!) 156/92   Pulse 84   Temp 99 F (37.2 C) (Oral)   Resp 18   SpO2 99%   Labs: No results found for this or any previous visit (from the past 48 hour(s)).  Imaging / Studies: No  results found.   Adin Hector, M.D., F.A.C.S. Gastrointestinal and Minimally Invasive Surgery [facility] [address] [place], [place] [address] [phone] Main / Paging  09/02/2019 12:10 PM    Adin Hector

## 2019-09-02 NOTE — Anesthesia Preprocedure Evaluation (Addendum)
Anesthesia Evaluation  Patient identified by MRN, date of birth, ID band Patient awake    Reviewed: Allergy & Precautions, NPO status , Patient's Chart, lab work & pertinent test results  Airway Mallampati: II  TM Distance: >3 FB Neck ROM: Full    Dental no notable dental hx. (+) Teeth Intact, Dental Advisory Given   Pulmonary PE   Pulmonary exam normal breath sounds clear to auscultation       Cardiovascular hypertension, Pt. on medications Normal cardiovascular exam+ dysrhythmias (PVCs, bigeminy)  Rhythm:Regular Rate:Normal  TTE 03/2018 - Left ventricle: The cavity size was normal. Systolic function wasnormal. The estimated ejection fraction was in the range of 60% to 65%. Wall motion was normal; there were no regional wall motion abnormalities. Left ventricular diastolic function parameters were normal.  - Aortic valve: Sclerosis without stenosis.  - Atrial septum: No defect or patent foramen ovale was identified.    Neuro/Psych negative neurological ROS  negative psych ROS   GI/Hepatic negative GI ROS, Neg liver ROS,   Endo/Other  negative endocrine ROS  Renal/GU negative Renal ROS  negative genitourinary   Musculoskeletal negative musculoskeletal ROS (+)   Abdominal   Peds  Hematology  (+) Blood dyscrasia (Factor V Leiden, on eliquis), ,   Anesthesia Other Findings   Reproductive/Obstetrics                            Anesthesia Physical Anesthesia Plan  ASA: II  Anesthesia Plan: General   Post-op Pain Management:    Induction: Intravenous  PONV Risk Score and Plan: 2 and Midazolam, Dexamethasone and Ondansetron  Airway Management Planned: Oral ETT  Additional Equipment:   Intra-op Plan:   Post-operative Plan: Extubation in OR  Informed Consent: I have reviewed the patients History and Physical, chart, labs and discussed the procedure including the risks, benefits and  alternatives for the proposed anesthesia with the patient or authorized representative who has indicated his/her understanding and acceptance.     Dental advisory given  Plan Discussed with: CRNA  Anesthesia Plan Comments:         Anesthesia Quick Evaluation

## 2019-09-02 NOTE — Transfer of Care (Signed)
Immediate Anesthesia Transfer of Care Note  Patient: Todd Gardner  Procedure(s) Performed: HEMORRHOIDECTOMY, HEMORRHOIDAL LIGATION/PEXY (N/A Rectum) ANORECTAL EXAM UNDER ANESTHESIA (N/A )  Patient Location: PACU  Anesthesia Type:General  Level of Consciousness: drowsy and patient cooperative  Airway & Oxygen Therapy: Patient Spontanous Breathing and Patient connected to face mask oxygen  Post-op Assessment: Report given to RN and Post -op Vital signs reviewed and stable  Post vital signs: Reviewed and stable  Last Vitals:  Vitals Value Taken Time  BP 167/102 09/02/19 1432  Temp    Pulse 78 09/02/19 1436  Resp 16 09/02/19 1436  SpO2 100 % 09/02/19 1436  Vitals shown include unvalidated device data.  Last Pain:  Vitals:   09/02/19 1152  TempSrc: Oral         Complications: No complications documented.

## 2019-09-02 NOTE — OR Nursing (Signed)
BP 200/100, Dr. Armond Hang called, orders received to give Labetalol 5 mg IVx1.

## 2019-09-02 NOTE — Discharge Instructions (Signed)
ANORECTAL SURGERY:  POST OPERATIVE INSTRUCTIONS  ######################################################################  EAT Start with a pureed / full liquid diet After 24 hours, gradually transition to a high fiber diet.    CONTROL PAIN Control pain so you can tolerate bowel movements,  walk, sleep, tolerate sneezing/coughing, and go up/down stairs.   HAVE A BOWEL MOVEMENT DAILY Keep your bowels regular to avoid problems.   Taking a fiber supplement every day to keep bowels soft.   Try a laxative to override constipation. Use an antidairrheal to slow down diarrhea.   Call if not better after 2 tries  WALK Walk an hour a day.  Control your pain to do that.   CALL IF YOU HAVE PROBLEMS/CONCERNS Call if you are still struggling despite following these instructions. Call if you have concerns not answered by these instructions  ######################################################################    1. Take your usually prescribed home medications unless otherwise directed.  2. DIET: Follow a light bland diet & liquids the first 24 hours after arrival home, such as soup, liquids, starches, etc.  Be sure to drink plenty of fluids.  Quickly advance to a usual solid diet within a few days.  Avoid fast food or heavy meals as your are more likely to get nauseated or have irregular bowels.  A low-fat, high-fiber diet for the rest of your life is ideal.  3. PAIN CONTROL: a. Pain is best controlled by a usual combination of three different methods TOGETHER: i. Ice/Heat ii. Over the counter pain medication iii. Prescription pain medication b. Expect swelling and discomfort in the anus/rectal area.  Warm water baths (30-60 minutes up to 6 times a day, especially after bowel meovements) will help. Use ice for the first few days to help decrease swelling and bruising, then switch to heat such as warm towels, sitz baths, warm baths, etc to help relax tight/sore spots and speed recovery.   Some people prefer to use ice alone, heat alone, alternating between ice & heat.  Experiment to what works for you.   c. It is helpful to take an over-the-counter pain medication continuously for the first few weeks.  Choose one of the following that works best for you: i. Naproxen (Aleve, etc)  Two 220mg tabs twice a day ii. Ibuprofen (Advil, etc) Three 200mg tabs four times a day (every meal & bedtime) iii. Acetaminophen (Tylenol, etc) 500-650mg four times a day (every meal & bedtime) d. A  prescription for pain medication (such as oxycodone, hydrocodone, etc) should be given to you upon discharge.  Take your pain medication as prescribed.  i. If you are having problems/concerns with the prescription medicine (does not control pain, nausea, vomiting, rash, itching, etc), please call us [phone] to see if we need to switch you to a different pain medicine that will work better for you and/or control your side effect better. ii. If you need a refill on your pain medication, please contact your pharmacy.  They will contact our office to request authorization. Prescriptions will not be filled after 5 pm or on week-ends.  If can take up to 48 hours for it to be filled & ready so avoid waiting until you are down to thel ast pill. e. A topical cream (Dibucaine) or a prescription for a cream (such as diltiazem 2% gel) may be given to you.  Many people find relief with topical creams.  Some people find it burns too much.  Experiment.  If it helps, use it.  If it burns, don't using   it.  Use a Sitz Bath 4-8 times a day for relief   Sitz Bath A sitz bath is a warm water bath taken in the sitting position that covers only the hips and buttocks. It may be used for either healing or hygiene purposes. Sitz baths are also used to relieve pain, itching, or muscle spasms. The water may contain medicine. Moist heat will help you heal and relax.  HOME CARE INSTRUCTIONS  Take 3 to 4 sitz baths a day. 1. Fill the  bathtub half full with warm water. 2. Sit in the water and open the drain a little. 3. Turn on the warm water to keep the tub half full. Keep the water running constantly. 4. Soak in the water for 15 to 20 minutes. 5. After the sitz bath, pat the affected area dry first.   4. KEEP YOUR BOWELS REGULAR a. The goal is one soft bowel movement a day b. Avoid getting constipated.  Between the surgery and the pain medications, it is common to experience some constipation.  Increasing fluid intake and taking a fiber supplement (such as Metamucil, Citrucel, FiberCon, MiraLax, etc) 2-3 times a day regularly will usually help prevent this problem from occurring.  A mild laxative (prune juice, Milk of Magnesia, MiraLax, etc) should be taken according to package directions if there are no bowel movements after 48 hours. c. Watch out for diarrhea.  If you have many loose bowel movements, simplify your diet to bland foods & liquids for a few days.  Stop any stool softeners and decrease your fiber supplement.  Switching to mild anti-diarrheal medications (Kayopectate, Pepto Bismol) can help.  Can try an imodium/loperamide dose.  If this worsens or does not improve, please call us.  5. Wound Care  a. Remove your bandages with your first bowel movement, usually the day after surgery.  You may have packing if you had an abscess.  Let any packing or gauze fall come out.   b. Wear an absorbent pad or soft cotton balls in your underwear as needed to catch any drainage and help keep the area  c. Keep the area clean and dry.  Bathe / shower every day.  Keep the area clean by showering / bathing over the incision / wound.   It is okay to soak an open wound to help wash it.  Consider using a squeeze bottle filled with warm water to gently wash the anal area.  Wet wipes or showers / gentle washing after bowel movements is often less traumatic than regular toilet paper. d. You will often notice bleeding with bowel movements.   This should slow down by the end of the first week of surgery.  Sitting on an ice pack can help. e. Expect some drainage.  This should slow down by the end of the first week of surgery, but you will have occasional bleeding or drainage up to a few months after surgery.  Wear an absorbent pad or soft cotton gauze in your underwear until the drainage stops.  6. ACTIVITIES as tolerated:   a. You may resume regular (light) daily activities beginning the next day--such as daily self-care, walking, climbing stairs--gradually increasing activities as tolerated.  If you can walk 30 minutes without difficulty, it is safe to try more intense activity such as jogging, treadmill, bicycling, low-impact aerobics, swimming, etc. b. Save the most intensive and strenuous activity for last such as sit-ups, heavy lifting, contact sports, etc  Refrain from any heavy lifting or straining   until you are off narcotics for pain control.   c. DO NOT PUSH THROUGH PAIN.  Let pain be your guide: If it hurts to do something, don't do it.  Pain is your body warning you to avoid that activity for another week until the pain goes down. d. You may drive when you are no longer taking prescription pain medication, you can comfortably sit for long periods of time, and you can safely maneuver your car and apply brakes. e. You may have sexual intercourse when it is comfortable.  7. FOLLOW UP in our office a. Please call [facility] at [phone] to set up an appointment to see your surgeon in the office for a follow-up appointment approximately 2-3 weeks after your surgery. b. Make sure that you call for this appointment the day you arrive home to ensure a convenient appointment time.  8. IF YOU HAVE DISABILITY OR FAMILY LEAVE FORMS, BRING THEM TO THE OFFICE FOR PROCESSING.  DO NOT GIVE THEM TO YOUR DOCTOR.        WHEN TO CALL US [phone]: 1. Poor pain control 2. Reactions / problems with new medications (rash/itching, nausea,  etc)  3. Fever over 101.5 F (38.5 C) 4. Inability to urinate 5. Nausea and/or vomiting 6. Worsening swelling or bruising 7. Continued bleeding from incision. 8. Increased pain, redness, or drainage from the incision  The clinic staff is available to answer your questions during regular business hours (8:30am-5pm).  Please don't hesitate to call and ask to speak to one of our nurses for clinical concerns.   A surgeon from [facility] is always on call at the hospitals   If you have a medical emergency, go to the nearest emergency room or call 911.    [facility] [address], [address], [place], [place]  [address] ? MAIN: [phone] ? TOLL FREE: [phone] ? FAX [phone] [email or web address]    HEMORRHOIDS   Hemorrhoidal piles are natural clusters of blood vessels that help the rectum and anal canal stretch to hold stool and allow bowel movements.  Most people will develop a flare of hemorrhoids in their lifetime.  When hemorrhoidals are irritated, they can swell, burn, itch, cause pain, and bleed.  Most flares will calm down gradually within a few weeks.  However, once hemorrhoids are created, they tend to flare more easily.  Fortunately, good habits and simple medical treatment usually control hemorrhoids well, and surgery is needed only in severe cases.  TREATMENT OF HEMORRHOID FLARE Warm soaks. 4-8 times a day This helps more than any topical medication.   1. A sitz bath is a warm water bath taken in the sitting position that covers only the hips and buttocks.Fill the bathtub half full with warm water. 2. Soak in the water for 15 to 30 minutes. 3. After the sitz bath, pat the affected area dry first.  Normalize your bowels.  Extremes of diarrhea or constipation will make hemorrhoids worse.  One soft bowel movement a day is the goal.   Wet wipes instead of toilet paper Pain control with a NSAID such as ibuprofen (Advil) or  naproxen (Aleve) or acetaminophen (Tylenol) around the clock.  Narcotics are constipating and should be minimized if possible Topical creams contain steroids (bydrocortisone) or local anesthetic (xylocaine) can help make pain and itching more tolerable.    TROUBLESHOOTING IRREGULAR BOWELS 1) Avoid extremes of bowel movements (no bad constipation/diarrhea) 2) Miralax 17gm in 8oz. water or juice every   day. May use twice a day.  3) Gas-x or Phazyme as needed for gas & bloating.  4) Soft & bland diet. No spicy, greasy, or fried foods.  5) Omeprazole over-the-counter as needed  6) May hold gluten/wheat products from diet to see if symptoms improve.  7)  May try probiotics (Align, Activa, etc) to help calm the bowels down 7) If symptoms become worse: Call back immediately.   

## 2019-09-02 NOTE — Anesthesia Procedure Notes (Signed)
Procedure Name: Intubation Date/Time: 09/02/2019 12:57 PM Performed by: Eben Burow, CRNA Pre-anesthesia Checklist: Patient identified, Emergency Drugs available, Suction available, Patient being monitored and Timeout performed Patient Re-evaluated:Patient Re-evaluated prior to induction Oxygen Delivery Method: Circle system utilized Preoxygenation: Pre-oxygenation with 100% oxygen Induction Type: IV induction Ventilation: Mask ventilation without difficulty Laryngoscope Size: Mac and 4 Grade View: Grade II Tube type: Oral Tube size: 7.5 mm Number of attempts: 1 Airway Equipment and Method: Stylet Placement Confirmation: ETT inserted through vocal cords under direct vision,  positive ETCO2 and breath sounds checked- equal and bilateral Secured at: 23 cm Tube secured with: Tape Dental Injury: Teeth and Oropharynx as per pre-operative assessment

## 2019-09-02 NOTE — H&P (Signed)
Todd Gardner DOB: 10-07-1955   Patient Care Team: Tally Joe, MD as PCP - General (Family Medicine) Jake Bathe, MD as PCP - Cardiology (Cardiology) Karie Soda, MD as Consulting Physician (General Surgery) Bernette Redbird, MD as Consulting Physician (Gastroenterology)  ` Patient sent for surgical consultation  Chief Complaint: Rectal bleeding. Concern for worsening hemorrhoid ` ` The patient is a gentleman that is struggled with rectal bleeding and presumed hemorrhoid problems for many years. Was seen in 2018. Hemorrhoids do not seem that severe. Fiber bowel regimen recommended. Dr. is a by retired. Had worsening episodes of rectal bleeding. Dr. Maisie Fus with our group over a year ago. She offered banding. He delayed given the Covid pandemic. Worsening symptoms. Wished to be reevaluated. Wished a second opinion. Was hoping just for banding. Initially to be sent to Dr. Cliffton Asters but then transferred to me since Dr. Cliffton Asters does not debanding. Patient somewhat frustrated with the last minute change but seems fine now.  Patient is anticoagulated on Eliquis. 3 pulmonary embolisms and found to have factor V Leiden deficiency. Followed by Dr. Percell Boston. Some hypertension but controlled. Usually moves his bowels every day or so. Intermittently uses a fiber supplement. Can walk a half hour without difficulty. No prior abdominal surgery. Gets up to 90 urinate once a night. No major changes. Because of worsening hemorrhoids, he wished to see if he can be banded to get them under control. Patient is followed by Dr. Matthias Hughs with [facility] gastroenterology. Has had colonoscopies in the past. Last 1 2017 with 1 polyp. Five-year follow-up recommended. Since that time, patient's father and sister were diagnosed with colorectal cancer. Patient is thinking about doing a colonoscopy this year, a year sooner.   No personal nor family history of inflammatory bowel disease, irritable  bowel syndrome, allergy such as Celiac Sprue, dietary/dairy problems, colitis, ulcers nor gastritis. No recent sick contacts/gastroenteritis. No recent travel outside the country (originally from the [place]). No changes in diet. No dysphagia to solids or liquids. No significant heartburn or reflux. No melena, hematemesis, coffee ground emesis. No evidence of prior gastric/peptic ulceration.  (Review of systems as stated in this history (HPI) or in the review of systems. Otherwise all other 12 point ROS are negative) ` ` `  This patient encounter took 55 minutes today to perform the following: obtain history, perform exam, review outside records, interpret tests & imaging, counsel the patient on their diagnosis; and, document this encounter, including findings & plan in the electronic health record (EHR).   Allergies (Tanisha A. Manson Passey, RMA; 07/12/2019 12:00 PM) EPINEPHrine *VASOPRESSORS* Lanolin *PHARMACEUTICAL ADJUVANTS* Lidocaine *CHEMICALS* Allergies Reconciled  Medication History (Tanisha A. Manson Passey, RMA; 07/12/2019 12:01 PM) Eliquis (5MG  Tablet, Oral) Active. Multivitamin (Oral) Active. Claritin (10MG  Tablet, Oral) Active. Losartan Potassium (25MG  Tablet, Oral) Active. Medications Reconciled    Vitals (Tanisha A. Brown RMA; 07/12/2019 12:01 PM) 07/12/2019 12:01 PM Weight: 205.8 lb Height: 75in Body Surface Area: 2.22 m Body Mass Index: 25.72 kg/m  Temp.: 98.77F  Pulse: 112 (Regular)  BP: 126/72(Sitting, Left Arm, Standard)   BP (!) 156/92   Pulse 84   Temp 99 F (37.2 C) (Oral)   Resp 18   SpO2 99%  09/02/2019      Physical Exam 07/14/2019 MD; 07/12/2019 1:01 PM)  General Mental Status-Alert. General Appearance-Not in acute distress, Not Sickly. Orientation-Oriented X3. Hydration-Well hydrated. Voice-Normal.  Integumentary Global Assessment Upon inspection and palpation of skin surfaces of the - Axillae:  non-tender, no inflammation or ulceration,  no drainage. and Distribution of scalp and body hair is normal. General Characteristics Temperature - normal warmth is noted.  Head and Neck Head-normocephalic, atraumatic with no lesions or palpable masses. Face Global Assessment - atraumatic, no absence of expression. Neck Global Assessment - no abnormal movements, no bruit auscultated on the right, no bruit auscultated on the left, no decreased range of motion, non-tender. Trachea-midline. Thyroid Gland Characteristics - non-tender. Note: Wears glasses. Vision corrected  Eye Eyeball - Left-Extraocular movements intact, No Nystagmus - Left. Eyeball - Right-Extraocular movements intact, No Nystagmus - Right. Cornea - Left-No Hazy - Left. Cornea - Right-No Hazy - Right. Sclera/Conjunctiva - Left-No scleral icterus, No Discharge - Left. Sclera/Conjunctiva - Right-No scleral icterus, No Discharge - Right. Pupil - Left-Direct reaction to light normal. Pupil - Right-Direct reaction to light normal.  ENMT Ears Pinna - Left - no drainage observed, no generalized tenderness observed. Pinna - Right - no drainage observed, no generalized tenderness observed. Nose and Sinuses External Inspection of the Nose - no destructive lesion observed. Inspection of the nares - Left - quiet respiration. Inspection of the nares - Right - quiet respiration. Mouth and Throat Lips - Upper Lip - no fissures observed, no pallor noted. Lower Lip - no fissures observed, no pallor noted. Nasopharynx - no discharge present. Oral Cavity/Oropharynx - Tongue - no dryness observed. Oral Mucosa - no cyanosis observed. Hypopharynx - no evidence of airway distress observed.  Chest and Lung Exam Inspection Movements - Normal and Symmetrical. Accessory muscles - No use of accessory muscles in breathing. Palpation Palpation of the chest reveals - Non-tender. Auscultation Breath sounds - Normal and  Clear.  Cardiovascular Auscultation Rhythm - Regular. Murmurs & Other Heart Sounds - Auscultation of the heart reveals - No Murmurs and No Systolic Clicks.  Abdomen Inspection Inspection of the abdomen reveals - No Visible peristalsis and No Abnormal pulsations. Umbilicus - No Bleeding, No Urine drainage. Palpation/Percussion Palpation and Percussion of the abdomen reveal - Soft, Non Tender, No Rebound tenderness, No Rigidity (guarding) and No Cutaneous hyperesthesia. Note: Abdomen soft. Nontender. Not distended. No umbilical or incisional hernias. No guarding.  Male Genitourinary Sexual Maturity Tanner 5 - Adult hair pattern and Adult penile size and shape. Note: No inguinal hernias. Normal external genitalia. Epididymi, testes, and spermatic cords normal without any masses.  Rectal Note: Obvious right sided prolapsed hemorrhoid with inflammation and sensitivity. No necrosis or thrombosis. Increased sphincter tone. Very sensitive. No obvious fissure or abscess. No pilonidal disease. Mild moister but no major pruritus. Held off on internal exam  Peripheral Vascular Upper Extremity Inspection - Left - No Cyanotic nailbeds - Left, Not Ischemic. Inspection - Right - No Cyanotic nailbeds - Right, Not Ischemic.  Neurologic Neurologic evaluation reveals -normal attention span and ability to concentrate, able to name objects and repeat phrases. Appropriate fund of knowledge , normal sensation and normal coordination. Mental Status Affect - not angry, not paranoid. Cranial Nerves-Normal Bilaterally. Gait-Normal.  Neuropsychiatric Mental status exam performed with findings of-able to articulate well with normal speech/language, rate, volume and coherence, thought content normal with ability to perform basic computations and apply abstract reasoning and no evidence of hallucinations, delusions, obsessions or homicidal/suicidal ideation.  Musculoskeletal Global  Assessment Spine, Ribs and Pelvis - no instability, subluxation or laxity. Right Upper Extremity - no instability, subluxation or laxity.  Lymphatic Head & Neck  General Head & Neck Lymphatics: Bilateral - Description - No Localized lymphadenopathy. Axillary  General Axillary Region: Bilateral - Description - No  Localized lymphadenopathy. Femoral & Inguinal  Generalized Femoral & Inguinal Lymphatics: Left - Description - No Localized lymphadenopathy. Right - Description - No Localized lymphadenopathy.    Assessment & Plan Todd Hector MD; 07/12/2019 12:56 PM)  PROLAPSED INTERNAL HEMORRHOIDS, GRADE 4 (K64.3) Impression: Obviously prolapsed hemorrhoid. NO thrombosis nor abscess. Very sensitive. I think this is the most obvious expiration for his rectal bleeding and discomfort.  I think because it is now chronically prolapsed, banding would not be appropriate. He is too sensitive for that anyway.  I recommended examination anesthesia with hemorrhoidal ligation/pexy. Probable hemorrhoidectomy of the dominant pile. Lousily make sure there are no other issues. Hopefully should be an outpatient surgery. He is interested in proceeding.  Like to make sure it safe to come off his anticoagulation for a couple days. We'll recheck his cardiologist, Dr. Gypsy Balsam, to make sure that that is okay.  He's been followed by Dr. Ernest Haber with gastroenterology. 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 However, both his sister and father were diagnosed with colorectal cancer. Passed away from it. I think it makes sense to reach out to Dr. Ernest Haber and see if he should get scoped later this year. I would wait at least 3 months after hemorrhoid surgery before proceeding.  We discussed my recommendations. He seemed comfortable with this plan. I gave him a chance to ask questions and answered them. He expressed understanding and appreciation. He agrees with my recommendations and we will  work to proceed with examination under anesthesia and hemorrhoid surgery  The anatomy & physiology of the anorectal region was discussed. The pathophysiology of hemorrhoids and differential diagnosis was discussed. Natural history risks without surgery was discussed. I stressed the importance of a bowel regimen to have daily soft bowel movements to minimize progression of disease. Interventions such as sclerotherapy & banding were discussed.  The patient's symptoms are not adequately controlled by medicines and other non-operative treatments. I feel the risks & problems of no surgery outweigh the operative risks; therefore, I recommended surgery to treat the hemorrhoids by ligation, pexy, and possible resection.  Risks such as bleeding, infection, urinary difficulties, need for further treatment, heart attack, death, and other risks were discussed. I noted a good likelihood this will help address the problem. Goals of post-operative recovery were discussed as well. Possibility that this will not correct all symptoms was explained. Post-operative pain, bleeding, constipation, and other problems after surgery were discussed. We will work to minimize complications. Educational handouts further explaining the pathology, treatment options, and bowel regimen were given as well. Questions were answered. The patient expresses understanding & wishes to proceed with surgery.  Pt Education - [facility] Rectal Prep for Anorectal outpatient/office surgery: discussed with patient and provided information. Pt Education - [facility] Rectal Surgery HCI (Ilija Maxim): discussed with patient and provided information.  HOMOZYGOUS FACTOR V LEIDEN MUTATION (D68.51)   HISTORY OF PULMONARY EMBOLISM (Z86.711)  Current Plans I recommended obtaining preoperative cardiac clearance for recommendations on management of anticoagualtion perioperatively:  1. Timing of holding anticoagulation 2. Need for any bridge  therapy (SQ enoxaparin, IV heparin, IV Aggrastat, etc) preop/postop. 3. Desired timing of resumption of anticoagulation.  In general from Dr. Johney Maine' standpoint:  Aspirin is okay to continue perioperatively (81mg  or 325mg ) & does not need to be held  Hold warfarin 5 dayspreoperatively. Consider PT/INR level on arrival to short stay the day of surgery. No need to check PT/INR level on preop visit  Hold P2Y12 inibitors such as clopidrogel (Plavix) 4  dayspreoperatively  Hold direct thrombin / factor Xa inhibitors (Xerelto, Pradaxa, Eliquis, etc) 2 dayspreoperatively   Request clearance by cardiology to better assess operative risk & see if a reevaluation, further workup, etc is needed. Also recommendations on how medications such as for anticoagulation and blood pressure should be managed/held/restarted after surgery.   FAMILY HISTORY OF COLORECTAL CANCER (Z80.0)  Current Plans Instructed to schedule colonoscopywith a gastroenterologist  Ardeth Sportsman, MD, FACS, MASCRS Gastrointestinal and Minimally Invasive Surgery  [facility] [address], [address] [place], [place] [address] [phone] Main / Paging [phone] Fax

## 2019-09-02 NOTE — Op Note (Signed)
09/02/2019  2:06 PM  PATIENT:  Todd Gardner  64 y.o. male  Patient Care Team: Tally Joe, MD as PCP - General (Family Medicine) Jake Bathe, MD as PCP - Cardiology (Cardiology) Karie Soda, MD as Consulting Physician (General Surgery) Bernette Redbird, MD as Consulting Physician (Gastroenterology)  PRE-OPERATIVE DIAGNOSIS:  HEMORRHOIDS PROLAPSED GRADE 4 WITH BLEEDING AND WITH PAIN  POST-OPERATIVE DIAGNOSIS:  HEMORRHOIDS PROLAPSED GRADE 4 WITH BLEEDING AND WITH PAIN  PROCEDURE:   Internal and external hemorrhoidectomy  x2 Internal hemorrhoidal ligation and pexy Anorectal examination under anesthesia  SURGEON:  Ardeth Sportsman, MD  ANESTHESIA:   General Anorectal & Local field block (0.25% bupivacaine with epinephrine mixed with Liposomal bupivacaine (Experel) -patient claimed allergy to lidocaine which actually was not burning when lidocaine jelly placed an open wound. Sounds more like a side effect or expected reaction. I discussed with anesthesia and both of [place] felt it was safe to give bupivacaine. Of note, patient received IV lidocaine intraoperatively as part of the enhanced recovery protocol for anesthesia with no events.  EBL:  No intake/output data recorded..  See operative record  Delay start of Pharmacological VTE agent (>24hrs) due to surgical blood loss or risk of bleeding:  NO  DRAINS: NONE  SPECIMEN:   Internal & external hemorrhoid x2  DISPOSITION OF SPECIMEN:  PATHOLOGY  COUNTS:  YES  PLAN OF CARE: Discharge home after PACU  PATIENT DISPOSITION:  PACU - hemodynamically stable.  INDICATION: Pleasant patient with struggles with hemorrhoids.  Prior banding but now with chronically prolapsed/prolapsing hemorrhoid.  Not able to be managed in the office despite a improved bowel regimen.  I recommended examination under anesthesia and surgical treatment:  The anatomy & physiology of the anorectal region was discussed.  The pathophysiology of hemorrhoids  and differential diagnosis was discussed.  Natural history risks without surgery was discussed.   I stressed the importance of a bowel regimen to have daily soft bowel movements to minimize progression of disease.  Interventions such as sclerotherapy & banding were discussed.  The patient's symptoms are not adequately controlled by medicines and other non-operative treatments.  I feel the risks & problems of no surgery outweigh the operative risks; therefore, I recommended surgery to treat the hemorrhoids by ligation, pexy, and possible resection.  Risks such as bleeding, infection, need for further treatment, heart attack, death, and other risks were discussed.   I noted a good likelihood this will help address the problem.  Goals of post-operative recovery were discussed as well.  Possibility that this will not correct all symptoms was explained.  Post-operative pain, bleeding, constipation, urinary difficulties, and other problems after surgery were discussed.  We will work to minimize complications.   Educational handouts further explaining the pathology, treatment options, and bowel regimen were given as well.  Questions were answered.  The patient expresses understanding & wishes to proceed with surgery.  OR FINDINGS: Grade 4 right posterior greater than grade 3 right anterior inflamed & thickened hemorrhoids. C/w prior banding.  Left lateral grade 2.  No fissure, fistula, abscess. No sphincter defect. Some redundant rectum but no true prolapse. No condyloma.  DESCRIPTION:   Informed consent was confirmed. Patient underwent general anesthesia without difficulty. Patient was placed into prone positioning.  The perianal region was prepped and draped in sterile fashion. Surgical time-out confirmed our plan.  I did digital rectal examination and then transitioned over to anoscopy to get a sense of the anatomy.  Findings noted above.   I  proceeded to do hemorrhoidal ligation and pexy.  I used a 2-0  Vicryl suture on a UR-6 needle in a figure-of-eight fashion 6 cm proximal to the anal verge.  I started at the largest hemorrhoid pile, right posterior.  Because of redundant hemorrhoidal tissue too bulky to merely ligate or pexy, I excised the excess internal hemorrhoid piles longitudinally in a fusiform biconcave fashion, at the  right anterior and right posterior location, sparing the anal canal to avoid narrowing.  I then ran that stitch longitudinally more distally to close the hemorrhoidectomy wound to the anal verge over a Parks self retaining retractor & occasionally a large Hill-Furgeson retractor to avoid narrowing of the anal canal.  I then tied that stitch down to cause a hemorrhoidopexy.   I then did hemorrhoidal ligation and pexy at the other 4 columns.  At the completion of this, all 6 anorectal columns were ligated and pexied in the classic hexagonal fashion (right anterior/lateral/posterior, left anterior/lateral/posterior).  I trimmed down & closed the external part of the right posterior hemorrhoidectomy wound with interrupted horizontal mattress 2-0 chromic suture over a Sawyer retractor, leaving the last 5 mm open to allow natural drainage.    I redid anoscopy & examination.  At completion of this, all hemorrhoids had been removed or reduced into the rectum.  There is no more prolapse.  Internal & external anatomy was more more normal.  Hemostasis was good.  Fluffed gauze was on-laid over the perianal region.  No packing done.  Patient is being extubated go to go to the recovery room.  I had discussed postop care in detail with the patient in the preop holding area.  Instructions for post-operative recovery and prescriptions are written. I discussed operative findings, updated the patient's status, discussed probable steps to recovery, and gave postoperative recommendations to the patient's spouse.  Recommendations were made.  Questions were answered.  She expressed understanding &  appreciation.  Adin Hector, M.D., F.A.C.S. Gastrointestinal and Minimally Invasive Surgery [facility] [address], [place], [place] [address] [phone] Main / Paging

## 2019-09-03 ENCOUNTER — Encounter (HOSPITAL_COMMUNITY): Payer: Self-pay | Admitting: Surgery

## 2019-09-03 LAB — SURGICAL PATHOLOGY

## 2019-12-15 ENCOUNTER — Other Ambulatory Visit: Payer: Self-pay

## 2019-12-15 DIAGNOSIS — I7300 Raynaud's syndrome without gangrene: Secondary | ICD-10-CM | POA: Insufficient documentation

## 2019-12-15 DIAGNOSIS — R278 Other lack of coordination: Secondary | ICD-10-CM | POA: Insufficient documentation

## 2019-12-15 DIAGNOSIS — I83013 Varicose veins of right lower extremity with ulcer of ankle: Secondary | ICD-10-CM | POA: Insufficient documentation

## 2019-12-15 DIAGNOSIS — R040 Epistaxis: Secondary | ICD-10-CM | POA: Insufficient documentation

## 2019-12-15 DIAGNOSIS — Z8719 Personal history of other diseases of the digestive system: Secondary | ICD-10-CM | POA: Insufficient documentation

## 2019-12-15 DIAGNOSIS — H9190 Unspecified hearing loss, unspecified ear: Secondary | ICD-10-CM | POA: Insufficient documentation

## 2019-12-15 DIAGNOSIS — I493 Ventricular premature depolarization: Secondary | ICD-10-CM | POA: Insufficient documentation

## 2019-12-15 DIAGNOSIS — B070 Plantar wart: Secondary | ICD-10-CM | POA: Insufficient documentation

## 2019-12-15 DIAGNOSIS — I499 Cardiac arrhythmia, unspecified: Secondary | ICD-10-CM | POA: Insufficient documentation

## 2019-12-15 DIAGNOSIS — Z9109 Other allergy status, other than to drugs and biological substances: Secondary | ICD-10-CM | POA: Insufficient documentation

## 2019-12-15 DIAGNOSIS — I83819 Varicose veins of unspecified lower extremities with pain: Secondary | ICD-10-CM | POA: Insufficient documentation

## 2019-12-15 DIAGNOSIS — I872 Venous insufficiency (chronic) (peripheral): Secondary | ICD-10-CM

## 2019-12-15 DIAGNOSIS — I10 Essential (primary) hypertension: Secondary | ICD-10-CM | POA: Insufficient documentation

## 2019-12-15 DIAGNOSIS — Z800 Family history of malignant neoplasm of digestive organs: Secondary | ICD-10-CM | POA: Insufficient documentation

## 2019-12-28 ENCOUNTER — Ambulatory Visit: Payer: BC Managed Care – PPO | Admitting: Cardiology

## 2019-12-28 ENCOUNTER — Other Ambulatory Visit: Payer: Self-pay

## 2019-12-28 ENCOUNTER — Encounter: Payer: Self-pay | Admitting: Cardiology

## 2019-12-28 VITALS — BP 110/70 | HR 70 | Ht 75.0 in | Wt 204.6 lb

## 2019-12-28 DIAGNOSIS — D6851 Activated protein C resistance: Secondary | ICD-10-CM | POA: Diagnosis not present

## 2019-12-28 DIAGNOSIS — I493 Ventricular premature depolarization: Secondary | ICD-10-CM | POA: Diagnosis not present

## 2019-12-28 NOTE — Progress Notes (Signed)
Cardiology Office Note:    Date:  12/28/2019   ID:  Todd Gardner, DOB 01/08/1956, MRN [id number]  PCP:  Tally Joe, MD  [facility] Cardiologist:  Donato Schultz, MD  [facility] Electrophysiologist:  None   Referring MD: Tally Joe, MD     History of Present Illness:    Todd Gardner is a 64 y.o. male here for the follow-up of chronic anticoagulation with Eliquis for factor V Leiden deficiency prior pulmonary embolism and hypertension.  Prior EKG from 06/03/2019 showed ventricular bigeminy with heart rate of 78 bpm.  Previously, blood pressure readings were described.  Decreased his losartan after retiring.  Had weight loss, decrease stress.  Had had some midday dizziness at times.  Prior echocardiogram from 04/03/2018 reviewed showed normal EF with aortic valve sclerosis  Past Medical History:  Diagnosis Date  . Clotting disorder (HCC)   . Dysrhythmia    PVC's  . Factor V Leiden (HCC)    positive  . Hemorrhoids   . Hernia   . History of blood clots    Pulmonary embolism  . Hypertension   . Jock itch 12-20-11   mild case at present  . Poor circulation   . Pulmonary embolism (HCC) 1997  . Ulcer    leg  . Wears glasses     Past Surgical History:  Procedure Laterality Date  . HAND SURGERY     Left  . HEMORRHOID SURGERY N/A 09/02/2019   Procedure: HEMORRHOIDECTOMY, HEMORRHOIDAL LIGATION/PEXY;  Surgeon: Karie Soda, MD;  Location: [facility];  Service: General;  Laterality: N/A;  . INGUINAL HERNIA REPAIR  12/23/2011   Procedure: HERNIA REPAIR INGUINAL ADULT;  Surgeon: Adolph Pollack, MD;  Location: [facility];  Service: General;  Laterality: Left;  Left Inguinal Hernia with Mesh  . RECTAL EXAM UNDER ANESTHESIA N/A 09/02/2019   Procedure: ANORECTAL EXAM UNDER ANESTHESIA;  Surgeon: Karie Soda, MD;  Location: [facility];  Service: General;  Laterality: N/A;  . teeth extraction  01-01-2011  3 teeth extracted by oral surgeon  . vein ablation surgery  8/12 and 12/08  .  vericose vein surgery  1997   R leg    Current Medications: Current Meds  Medication Sig  . ELIQUIS 5 MG TABS tablet Take 5 mg by mouth 2 (two) times daily.  Marland Kitchen loratadine (CLARITIN) 10 MG tablet Take 10 mg by mouth daily as needed for allergies.   . Multiple Vitamins-Minerals (MULTIVITAMIN WITH MINERALS) tablet Take 1 tablet by mouth daily.  Marland Kitchen omeprazole (PRILOSEC) 40 MG capsule Take 40 mg by mouth 2 (two) times daily.  Marland Kitchen oxyCODONE (OXY IR/ROXICODONE) 5 MG immediate release tablet Take 1-2 tablets (5-10 mg total) by mouth every 6 (six) hours as needed for moderate pain, severe pain or breakthrough pain.  . [DISCONTINUED] losartan (COZAAR) 25 MG tablet Take 1 tablet (25 mg total) by mouth daily.     Allergies:   Lidocaine   Social History   Socioeconomic History  . Marital status: Married    Spouse name: Not on file  . Number of children: Y  . Years of education: Not on file  . Highest education level: Not on file  Occupational History  . Occupation: Firefighter: [employer]  Tobacco Use  . Smoking status: Never Smoker  . Smokeless tobacco: Never Used  Vaping Use  . Vaping Use: Never used  Substance and Sexual Activity  . Alcohol use: Yes    Alcohol/week:  2.0 standard drinks    Types: 2 Cans of beer per week    Comment: 3 dks per week  . Drug use: No  . Sexual activity: Yes  Other Topics Concern  . Not on file  Social History Narrative   Moved here from [place].   Social Determinants of Health   Financial Resource Strain:   . Difficulty of Paying Living Expenses: Not on file  Food Insecurity:   . Worried About Programme researcher, broadcasting/film/video in the Last Year: Not on file  . Ran Out of Food in the Last Year: Not on file  Transportation Needs:   . Lack of Transportation (Medical): Not on file  . Lack of Transportation (Non-Medical): Not on file  Physical Activity:   . Days of Exercise per Week: Not on file  . Minutes of Exercise per Session:  Not on file  Stress:   . Feeling of Stress : Not on file  Social Connections:   . Frequency of Communication with Friends and Family: Not on file  . Frequency of Social Gatherings with Friends and Family: Not on file  . Attends Religious Services: Not on file  . Active Member of Clubs or Organizations: Not on file  . Attends Banker Meetings: Not on file  . Marital Status: Not on file     Family History: The patient's family history includes Cancer in his maternal grandfather; Colon cancer in his sister.  ROS:   Please see the history of present illness.     All other systems reviewed and are negative.  EKGs/Labs/Other Studies Reviewed:      Recent Labs: 08/25/2019: BUN 11; Creatinine, Ser 0.89; Hemoglobin 16.3; Platelets 240; Potassium 4.4; Sodium 138  Recent Lipid Panel No results found for: CHOL, TRIG, HDL, CHOLHDL, VLDL, LDLCALC, LDLDIRECT   Risk Assessment/Calculations:       Physical Exam:    VS:  BP 110/70   Pulse 70   Ht 6\' 3"  (1.905 m)   Wt 204 lb 9.6 oz (92.8 kg)   SpO2 96%   BMI 25.57 kg/m     Wt Readings from Last 3 Encounters:  12/28/19 204 lb 9.6 oz (92.8 kg)  08/25/19 202 lb 4 oz (91.7 kg)  08/25/19 203 lb (92.1 kg)     GEN:  Well nourished, well developed in no acute distress HEENT: Normal NECK: No JVD; No carotid bruits LYMPHATICS: No lymphadenopathy CARDIAC: RRR, no murmurs, rubs, gallops RESPIRATORY:  Clear to auscultation without rales, wheezing or rhonchi  ABDOMEN: Soft, non-tender, non-distended MUSCULOSKELETAL:  No edema; No deformity  SKIN: Warm and dry, varicose veins noted right greater than left. NEUROLOGIC:  Alert and oriented x 3 PSYCHIATRIC:  Normal affect   ASSESSMENT:    1. PVC (premature ventricular contraction)   2. Factor V Leiden [facility])    PLAN:    In order of problems listed above:  Ventricular bigeminy/PVCs -Noted on prior EKG in March 2021.  Continuing with conservative management echocardiogram  showed normal function.  No evidence of cardiomyopathy. -Could always try beta-blocker or flecainide in the future if necessary. --Mild dizzy --will stop losartan. BP at times upper 90's.  Hopefully this will help.  I will see him back in 2 weeks.  If these dizzy events are still happening, we will place an event monitor.  Essential hypertension -Much better control now, decreased losartan previously.  Has had some morning dizziness.  Factor V Leiden deficiency with recurrent venous thrombosis -On Eliquis.  Continuing  to maintain.  Monitoring blood work closely.  Hemoglobin 16.5, creatinine 1.0 on 10/19/2019.  LDL 99  Varicose veins, right leg -- see vascular. TED hose. RFA prior. Right >left  Medication Adjustments/Labs and Tests Ordered: Current medicines are reviewed at length with the patient today.  Concerns regarding medicines are outlined above.  No orders of the defined types were placed in this encounter.  No orders of the defined types were placed in this encounter.   Patient Instructions  Medication Instructions:  Your physician has recommended you make the following change in your medication:  1.) stop losartan  *If you need a refill on your cardiac medications before your next appointment, please call your pharmacy*   Lab Work: none If you have labs (blood work) drawn today and your tests are completely normal, you will receive your results only by: Marland Kitchen MyChart Message (if you have MyChart) OR . A paper copy in the mail If you have any lab test that is abnormal or we need to change your treatment, we will call you to review the results.   Testing/Procedures: none   Follow-Up: At [facility], you and your health needs are our priority.  As part of our continuing mission to provide you with exceptional heart care, we have created designated Provider Care Teams.  These Care Teams include your primary Cardiologist (physician) and Advanced Practice Providers (APPs -   Physician Assistants and Nurse Practitioners) who all work together to provide you with the care you need, when you need it.  We recommend signing up for the patient portal called "MyChart".  Sign up information is provided on this After Visit Summary.  MyChart is used to connect with patients for Virtual Visits (Telemedicine).  Patients are able to view lab/test results, encounter notes, upcoming appointments, etc.  Non-urgent messages can be sent to your provider as well.   To learn more about what you can do with MyChart, go to [email or web address].    Your next appointment:   2 month(s)  The format for your next appointment:   In Person  Provider:   You may see Donato Schultz, MD or one of the following Advanced Practice Providers on your designated Care Team:    Norma Fredrickson, NP  Nada Boozer, NP  Georgie Chard, NP    Other Instructions      Signed, Donato Schultz, MD  12/28/2019 12:03 PM    [facility]

## 2019-12-28 NOTE — Patient Instructions (Signed)
Medication Instructions:  Your physician has recommended you make the following change in your medication:  1.) stop losartan  *If you need a refill on your cardiac medications before your next appointment, please call your pharmacy*   Lab Work: none If you have labs (blood work) drawn today and your tests are completely normal, you will receive your results only by: Marland Kitchen MyChart Message (if you have MyChart) OR . A paper copy in the mail If you have any lab test that is abnormal or we need to change your treatment, we will call you to review the results.   Testing/Procedures: none   Follow-Up: At [facility], you and your health needs are our priority.  As part of our continuing mission to provide you with exceptional heart care, we have created designated Provider Care Teams.  These Care Teams include your primary Cardiologist (physician) and Advanced Practice Providers (APPs -  Physician Assistants and Nurse Practitioners) who all work together to provide you with the care you need, when you need it.  We recommend signing up for the patient portal called "MyChart".  Sign up information is provided on this After Visit Summary.  MyChart is used to connect with patients for Virtual Visits (Telemedicine).  Patients are able to view lab/test results, encounter notes, upcoming appointments, etc.  Non-urgent messages can be sent to your provider as well.   To learn more about what you can do with MyChart, go to [email or web address].    Your next appointment:   2 month(s)  The format for your next appointment:   In Person  Provider:   You may see Donato Schultz, MD or one of the following Advanced Practice Providers on your designated Care Team:    Norma Fredrickson, NP  Nada Boozer, NP  Georgie Chard, NP    Other Instructions

## 2019-12-30 ENCOUNTER — Other Ambulatory Visit: Payer: Self-pay

## 2019-12-30 ENCOUNTER — Ambulatory Visit: Payer: BC Managed Care – PPO | Admitting: Vascular Surgery

## 2019-12-30 ENCOUNTER — Ambulatory Visit (HOSPITAL_COMMUNITY)
Admission: RE | Admit: 2019-12-30 | Discharge: 2019-12-30 | Disposition: A | Discharge status: Home / Self Care | Payer: BC Managed Care – PPO | Source: Ambulatory Visit | Attending: Vascular Surgery | Admitting: Vascular Surgery

## 2019-12-30 ENCOUNTER — Encounter: Payer: Self-pay | Admitting: Vascular Surgery

## 2019-12-30 VITALS — BP 140/76 | HR 63 | Temp 97.7°F | Resp 16 | Ht 75.0 in | Wt 201.0 lb

## 2019-12-30 DIAGNOSIS — I83811 Varicose veins of right lower extremities with pain: Secondary | ICD-10-CM

## 2019-12-30 DIAGNOSIS — I872 Venous insufficiency (chronic) (peripheral): Secondary | ICD-10-CM | POA: Insufficient documentation

## 2019-12-30 NOTE — Progress Notes (Signed)
REASON FOR CONSULT:    Painful varicose veins of the right lower extremity.  The consult is requested by Dr. Tally Joe.   ASSESSMENT & PLAN:   PAINFUL VARICOSE VEINS OF THE RIGHT LOWER EXTREMITY: This patient has recurrent painful varicose veins of the right lower extremity.  He currently wears a knee-high stocking.  I have recommended daily leg elevation and we have discussed the proper positioning for this.  In addition we have fitted him for some thigh-high compression stockings with a gradient of 20 to 30 mmHg.  I have encouraged him to avoid prolonged sitting and standing.  We have discussed the importance of exercise specifically walking and water aerobics.  I also encouraged him to keep his skin well lubricated.  I will plan on seeing him back in 3 months.  If he is continues to have significant symptoms I think the only option would be high ligation of the right great saphenous vein with greater than 20 stab phlebectomies.  This would have to be done in the operating room.  He will call sooner if he has problems.   [name], MD Office: [phone]   HPI:   Todd Gardner is a pleasant 64 y.o. male, who was referred with painful varicose veins of the right lower extremity.  I have reviewed the records from the referring office.  The patient was seen on 10/19/2019.  He has a history of right lower extremity varicose veins and he had 2 previous ablation procedures performed according to these records.  In reviewing our records it looks like he had endovenous laser ablation of the right great saphenous vein with greater than 20 stab phlebectomies in 2017 by Dr. [name].  In addition, he tells me that he had stripping of his saphenous vein in [place] 25 years ago.  He has developed recurrent varicosities in his right leg and presents for further evaluation.  He describes a "banging" sensation overlying his varicose veins that is similar to the symptoms he has had previously when he had  vein issues prior to his procedures.  He describes some aching pain in his legs which is aggravated by standing and sitting relieved with elevation.  He does wear a knee-high compression stocking which helps his symptoms some.  He said no previous history of DVT.  However he has a history of a pulmonary embolus in the past and has a factor V Leiden condition.  He is on chronic Eliquis for this.  Past Medical History:  Diagnosis Date  . Clotting disorder (HCC)   . Dysrhythmia    PVC's  . Factor V Leiden (HCC)    positive  . Hemorrhoids   . Hernia   . History of blood clots    Pulmonary embolism  . Hypertension   . Jock itch 12-20-11   mild case at present  . Poor circulation   . Pulmonary embolism (HCC) 1997  . Ulcer    leg  . Wears glasses     Family History  Problem Relation Age of Onset  . Colon cancer Sister   . Cancer Maternal Grandfather        colon    SOCIAL HISTORY: Social History   Socioeconomic History  . Marital status: Married    Spouse name: Not on file  . Number of children: Y  . Years of education: Not on file  . Highest education level: Not on file  Occupational History  . Occupation: Firefighter: [employer]  Tobacco Use  . Smoking status: Never Smoker  . Smokeless tobacco: Never Used  Vaping Use  . Vaping Use: Never used  Substance and Sexual Activity  . Alcohol use: Yes    Alcohol/week: 2.0 standard drinks    Types: 2 Cans of beer per week    Comment: 3 dks per week  . Drug use: No  . Sexual activity: Yes  Other Topics Concern  . Not on file  Social History Narrative   Moved here from [place].   Social Determinants of Health   Financial Resource Strain:   . Difficulty of Paying Living Expenses: Not on file  Food Insecurity:   . Worried About Programme researcher, broadcasting/film/video in the Last Year: Not on file  . Ran Out of Food in the Last Year: Not on file  Transportation Needs:   . Lack of Transportation  (Medical): Not on file  . Lack of Transportation (Non-Medical): Not on file  Physical Activity:   . Days of Exercise per Week: Not on file  . Minutes of Exercise per Session: Not on file  Stress:   . Feeling of Stress : Not on file  Social Connections:   . Frequency of Communication with Friends and Family: Not on file  . Frequency of Social Gatherings with Friends and Family: Not on file  . Attends Religious Services: Not on file  . Active Member of Clubs or Organizations: Not on file  . Attends Banker Meetings: Not on file  . Marital Status: Not on file  Intimate Partner Violence:   . Fear of Current or Ex-Partner: Not on file  . Emotionally Abused: Not on file  . Physically Abused: Not on file  . Sexually Abused: Not on file    Allergies  Allergen Reactions  . Lidocaine Other (See Comments)    Topical Burning sensation     Current Outpatient Medications  Medication Sig Dispense Refill  . ELIQUIS 5 MG TABS tablet Take 5 mg by mouth 2 (two) times daily.    Marland Kitchen loratadine (CLARITIN) 10 MG tablet Take 10 mg by mouth daily as needed for allergies.     . Multiple Vitamins-Minerals (MULTIVITAMIN WITH MINERALS) tablet Take 1 tablet by mouth daily.    Marland Kitchen omeprazole (PRILOSEC) 40 MG capsule Take 40 mg by mouth 2 (two) times daily.    Marland Kitchen oxyCODONE (OXY IR/ROXICODONE) 5 MG immediate release tablet Take 1-2 tablets (5-10 mg total) by mouth every 6 (six) hours as needed for moderate pain, severe pain or breakthrough pain. 30 tablet 0   No current facility-administered medications for this visit.    REVIEW OF SYSTEMS:  [X]  denotes positive finding, [ ]  denotes negative finding Cardiac  Comments:  Chest pain or chest pressure:    Shortness of breath upon exertion:    Short of breath when lying flat:    Irregular heart rhythm:        Vascular    Pain in calf, thigh, or hip brought on by ambulation:    Pain in feet at night that wakes you up from your sleep:     Blood clot  in your veins:    Leg swelling:         Pulmonary    Oxygen at home:    Productive cough:     Wheezing:         Neurologic    Sudden weakness in arms or legs:     Sudden  numbness in arms or legs:     Sudden onset of difficulty speaking or slurred speech:    Temporary loss of vision in one eye:     Problems with dizziness:         Gastrointestinal    Blood in stool:     Vomited blood:         Genitourinary    Burning when urinating:     Blood in urine:        Psychiatric    Major depression:         Hematologic    Bleeding problems:    Problems with blood clotting too easily:        Skin    Rashes or ulcers:        Constitutional    Fever or chills:     PHYSICAL EXAM:   Vitals:   12/30/19 1543  BP: 140/76  Pulse: 63  Resp: 16  Temp: 97.7 F (36.5 C)  TempSrc: Temporal  SpO2: 97%  Weight: 201 lb (91.2 kg)  Height: 6\' 3"  (1.905 m)    GENERAL: The patient is a well-nourished male, in no acute distress. The vital signs are documented above. CARDIAC: There is a regular rate and rhythm.  VASCULAR: I do not detect carotid bruits. He has palpable pedal pulses. He has a large varicose veins in his right lower extremity as documented in the photographs below.  In addition he has significant hyperpigmentation of the medial malleolus on the right.      I did look at the proximal great saphenous vein myself with the SonoSite and this is significantly dilated with reflux however the vein is very tortuous and for this reason I do not think this proximal segment is amenable to laser ablation. PULMONARY: There is good air exchange bilaterally without wheezing or rales. ABDOMEN: Soft and non-tender with normal pitched bowel sounds.  MUSCULOSKELETAL: There are no major deformities or cyanosis. NEUROLOGIC: No focal weakness or paresthesias are detected. SKIN: There are no ulcers or rashes noted. PSYCHIATRIC: The patient has a normal affect.  DATA:    VENOUS DUPLEX: I  have independently interpreted his venous duplex scan today.  On the right side there is no evidence of DVT.  There is deep venous reflux involving the common femoral vein.  There is some reflux in the proximal right great saphenous vein below that the vein has been ablated.  The vein is not especially dilated in the proximal thigh.  There is no reflux in the small saphenous vein.  On the left side there is no evidence of DVT.  There is no deep venous reflux.  There is reflux in the left great saphenous vein down to the distal thigh.  The vein has diameters ranging from 0.27-0.4 cm.

## 2020-01-13 DIAGNOSIS — I8393 Asymptomatic varicose veins of bilateral lower extremities: Secondary | ICD-10-CM

## 2020-02-01 ENCOUNTER — Ambulatory Visit: Payer: BC Managed Care – PPO | Attending: Internal Medicine

## 2020-02-01 ENCOUNTER — Other Ambulatory Visit (HOSPITAL_BASED_OUTPATIENT_CLINIC_OR_DEPARTMENT_OTHER): Payer: Self-pay | Admitting: Internal Medicine

## 2020-02-01 DIAGNOSIS — Z23 Encounter for immunization: Secondary | ICD-10-CM

## 2020-02-01 MED FILL — PFIZER-BIONTECH COVID-19 VA: 30 | 1 days supply | Qty: 0 | Fill #0

## 2020-02-01 NOTE — Progress Notes (Signed)
° °  Covid-19 Vaccination Clinic  Name:  SHAHIR KAREN    MRN: [id number] DOB: [birth year]  02/01/2020  Mr. Bogdon was observed post Covid-19 immunization for 15 minutes without incident. He was provided with Vaccine Information Sheet and instruction to access the V-Safe system.   Mr. Bromell was instructed to call 911 with any severe reactions post vaccine:  Difficulty breathing   Swelling of face and throat   A fast heartbeat   A bad rash all over body   Dizziness and weakness   Immunizations Administered    Name Date Dose VIS Date Route   Pfizer COVID-19 Vaccine 02/01/2020 11:59 AM 0.3 mL 01/05/2020 Intramuscular   Manufacturer: [employer], [employer]   Lot: [id number]   NDC: [address]-4

## 2020-02-28 ENCOUNTER — Ambulatory Visit: Payer: BC Managed Care – PPO | Admitting: Cardiology

## 2020-02-28 ENCOUNTER — Encounter: Payer: Self-pay | Admitting: Cardiology

## 2020-02-28 ENCOUNTER — Other Ambulatory Visit: Payer: Self-pay

## 2020-02-28 VITALS — BP 130/80 | HR 75 | Ht 75.0 in | Wt 207.0 lb

## 2020-02-28 DIAGNOSIS — D6851 Activated protein C resistance: Secondary | ICD-10-CM

## 2020-02-28 DIAGNOSIS — I10 Essential (primary) hypertension: Secondary | ICD-10-CM | POA: Diagnosis not present

## 2020-02-28 NOTE — Patient Instructions (Signed)
Medication Instructions:  Your physician recommends that you continue on your current medications as directed. Please refer to the Current Medication list given to you today.  *If you need a refill on your cardiac medications before your next appointment, please call your pharmacy*   Lab Work: None ordered   Testing/Procedures: None ordered   Follow-Up: At [facility], you and your health needs are our priority.  As part of our continuing mission to provide you with exceptional heart care, we have created designated Provider Care Teams.  These Care Teams include your primary Cardiologist (physician) and Advanced Practice Providers (APPs -  Physician Assistants and Nurse Practitioners) who all work together to provide you with the care you need, when you need it.  We recommend signing up for the patient portal called "MyChart".  Sign up information is provided on this After Visit Summary.  MyChart is used to connect with patients for Virtual Visits (Telemedicine).  Patients are able to view lab/test results, encounter notes, upcoming appointments, etc.  Non-urgent messages can be sent to your provider as well.   To learn more about what you can do with MyChart, go to [email or web address].    Your next appointment:   6 month(s)  The format for your next appointment:   In Person  Provider:   Donato Schultz, MD    Thank you for choosing [facility]!!

## 2020-02-28 NOTE — Progress Notes (Signed)
Cardiology Office Note:    Date:  02/28/2020   ID:  Todd Gardner, DOB [birth year], MRN [id number]  PCP:  Todd Joe, MD  [facility] Cardiologist:  Todd Schultz, MD  [facility] Electrophysiologist:  None   Referring MD: Todd Joe, MD     History of Present Illness:    Todd Gardner is a 64 y.o. male here for the follow-up of chronic anticoagulation, factor V Leiden, prior PE, hypertension.  At previous visit he was showing me blood pressure readings in the 90s.  We decided to stop his losartan.  He is here today for follow-up.  He had some mild dizziness previously.  Previously blood pressure readings were noted.  We have decreased his losartan after retiring.  Midday dizziness  Echocardiogram in January 2020 showed normal EF with aortic valve sclerosis.  Doing well.  No longer feeling dizzy.  Blood pressure seems to be much better off of the losartan 25 mg a day.  He showed me several readings.  Sometimes he will get whitecoat hypertension in the office setting.  During Covid, he decided to retire from [employer].  His wife also retired as well.  He will be joining Medicare soon.  Has plans to travel [address] and the [facility].  Past Medical History:  Diagnosis Date  . Clotting disorder (HCC)   . Dysrhythmia    PVC's  . Factor V Leiden (HCC)    positive  . Hemorrhoids   . Hernia   . History of blood clots    Pulmonary embolism  . Hypertension   . Jock itch 12-20-11   mild case at present  . Poor circulation   . Pulmonary embolism (HCC) 1997  . Ulcer    leg  . Wears glasses     Past Surgical History:  Procedure Laterality Date  . HAND SURGERY     Left  . HEMORRHOID SURGERY N/A 09/02/2019   Procedure: HEMORRHOIDECTOMY, HEMORRHOIDAL LIGATION/PEXY;  Surgeon: Karie Soda, MD;  Location: [facility];  Service: General;  Laterality: N/A;  . INGUINAL HERNIA REPAIR  12/23/2011   Procedure: HERNIA REPAIR INGUINAL ADULT;   Surgeon: Adolph Pollack, MD;  Location: [facility];  Service: General;  Laterality: Left;  Left Inguinal Hernia with Mesh  . RECTAL EXAM UNDER ANESTHESIA N/A 09/02/2019   Procedure: ANORECTAL EXAM UNDER ANESTHESIA;  Surgeon: Karie Soda, MD;  Location: [facility];  Service: General;  Laterality: N/A;  . teeth extraction  01-01-2011  3 teeth extracted by oral surgeon  . vein ablation surgery  8/12 and 12/08  . vericose vein surgery  1997   R leg    Current Medications: Current Meds  Medication Sig  . ELIQUIS 5 MG TABS tablet Take 5 mg by mouth 2 (two) times daily.  Marland Kitchen loratadine (CLARITIN) 10 MG tablet Take 10 mg by mouth daily as needed for allergies.   . Multiple Vitamins-Minerals (MULTIVITAMIN WITH MINERALS) tablet Take 1 tablet by mouth daily.  Marland Kitchen omeprazole (PRILOSEC) 40 MG capsule Take 40 mg by mouth 2 (two) times daily.  Marland Kitchen oxyCODONE (OXY IR/ROXICODONE) 5 MG immediate release tablet Take 1-2 tablets (5-10 mg total) by mouth every 6 (six) hours as needed for moderate pain, severe pain or breakthrough pain.     Allergies:   Lidocaine   Social History   Socioeconomic History  . Marital status: Married    Spouse name: Not on file  . Number of children: Y  . Years  of education: Not on file  . Highest education level: Not on file  Occupational History  . Occupation: Firefighter: [employer]  Tobacco Use  . Smoking status: Never Smoker  . Smokeless tobacco: Never Used  Vaping Use  . Vaping Use: Never used  Substance and Sexual Activity  . Alcohol use: Yes    Alcohol/week: 2.0 standard drinks    Types: 2 Cans of beer per week    Comment: 3 dks per week  . Drug use: No  . Sexual activity: Yes  Other Topics Concern  . Not on file  Social History Narrative   Moved here from [place].   Social Determinants of Health   Financial Resource Strain: Not on file  Food Insecurity: Not on file  Transportation Needs: Not on file  Physical Activity: Not  on file  Stress: Not on file  Social Connections: Not on file     Family History: The patient's family history includes Cancer in his maternal grandfather; Colon cancer in his sister.  ROS:   Please see the history of present illness.     All other systems reviewed and are negative.  EKGs/Labs/Other Studies Reviewed:    The following studies were reviewed today: Blood pressure readings   Recent Labs: 08/25/2019: BUN 11; Creatinine, Ser 0.89; Hemoglobin 16.3; Platelets 240; Potassium 4.4; Sodium 138  Recent Lipid Panel No results found for: CHOL, TRIG, HDL, CHOLHDL, VLDL, LDLCALC, LDLDIRECT   Risk Assessment/Calculations:       Physical Exam:    VS:  BP 130/80   Pulse 75   Ht 6\' 3"  (1.905 m)   Wt 207 lb (93.9 kg)   SpO2 97%   BMI 25.87 kg/m     Wt Readings from Last 3 Encounters:  02/28/20 207 lb (93.9 kg)  12/30/19 201 lb (91.2 kg)  12/28/19 204 lb 9.6 oz (92.8 kg)     GEN:  Well nourished, well developed in no acute distress HEENT: Normal NECK: No JVD; No carotid bruits LYMPHATICS: No lymphadenopathy CARDIAC: RRR, no murmurs, rubs, gallops RESPIRATORY:  Clear to auscultation without rales, wheezing or rhonchi  ABDOMEN: Soft, non-tender, non-distended MUSCULOSKELETAL:  No edema; No deformity  SKIN: Warm and dry NEUROLOGIC:  Alert and oriented x 3 PSYCHIATRIC:  Normal affect   ASSESSMENT:    1. Essential hypertension   2. Factor V Leiden [facility])    PLAN:    In order of problems listed above:  Ventricular bigeminy/PVCs -Back in March or 2021 noted on prior EKG.  Echocardiogram showed normal EF.  No evidence of cardiomyopathy. -Options include beta-blocker or flecainide in the future if necessary.  Dizziness/hypotension -Stopped losartan because blood pressures were in the upper 90s at 1 point. -Losartan stopped 25 mg a day of losartan.  Doing very well.  Factor V Leiden deficiency with recurrent venous thrombosis -Continuing on Eliquis.  Right leg  varicose vein -Vascular note reviewed.  Radiofrequency ablation previously.  TED hose.  Right greater than left.  We discussed optimal blood pressure goals again.       Medication Adjustments/Labs and Tests Ordered: Current medicines are reviewed at length with the patient today.  Concerns regarding medicines are outlined above.  No orders of the defined types were placed in this encounter.  No orders of the defined types were placed in this encounter.   Patient Instructions  Medication Instructions:  Your physician recommends that you continue on your current medications as directed. Please refer  to the Current Medication list given to you today.  *If you need a refill on your cardiac medications before your next appointment, please call your pharmacy*   Lab Work: None ordered   Testing/Procedures: None ordered   Follow-Up: At [facility], you and your health needs are our priority.  As part of our continuing mission to provide you with exceptional heart care, we have created designated Provider Care Teams.  These Care Teams include your primary Cardiologist (physician) and Advanced Practice Providers (APPs -  Physician Assistants and Nurse Practitioners) who all work together to provide you with the care you need, when you need it.  We recommend signing up for the patient portal called "MyChart".  Sign up information is provided on this After Visit Summary.  MyChart is used to connect with patients for Virtual Visits (Telemedicine).  Patients are able to view lab/test results, encounter notes, upcoming appointments, etc.  Non-urgent messages can be sent to your provider as well.   To learn more about what you can do with MyChart, go to [email or web address].    Your next appointment:   6 month(s)  The format for your next appointment:   In Person  Provider:   Donato Schultz, MD    Thank you for choosing [facility]!!        Signed, Todd Schultz, MD   02/28/2020 10:17 AM    [facility]

## 2020-04-06 ENCOUNTER — Ambulatory Visit: Payer: BC Managed Care – PPO | Admitting: Vascular Surgery

## 2020-07-03 ENCOUNTER — Ambulatory Visit: Payer: Self-pay | Attending: Internal Medicine

## 2020-07-03 DIAGNOSIS — Z23 Encounter for immunization: Secondary | ICD-10-CM

## 2020-07-03 NOTE — Progress Notes (Signed)
   Covid-19 Vaccination Clinic  Name:  Todd Gardner    MRN: [id number] DOB: 1955/07/02  07/03/2020  Mr. Todd Gardner was observed post Covid-19 immunization for 15 minutes without incident. He was provided with Vaccine Information Sheet and instruction to access the V-Safe system.   Mr. Todd Gardner was instructed to call 911 with any severe reactions post vaccine: Marland Kitchen Difficulty breathing  . Swelling of face and throat  . A fast heartbeat  . A bad rash all over body  . Dizziness and weakness   Immunizations Administered    Name Date Dose VIS Date Route   PFIZER Comrnaty(Gray TOP) Covid-19 Vaccine 07/03/2020 11:13 AM 0.3 mL 02/24/2020 Intramuscular   Manufacturer: [employer], [employer]   Lot: [id number]   NDC: [phone]

## 2020-07-06 ENCOUNTER — Other Ambulatory Visit (HOSPITAL_BASED_OUTPATIENT_CLINIC_OR_DEPARTMENT_OTHER): Payer: Self-pay

## 2020-07-06 MED ORDER — PFIZER-BIONT COVID-19 VAC-TRIS 30 MCG/0.3ML IM SUSP
INTRAMUSCULAR | 0 refills | Status: AC
  Filled 2020-07-06: qty 0.3, 1d supply, fill #0

## 2020-07-07 ENCOUNTER — Other Ambulatory Visit (HOSPITAL_BASED_OUTPATIENT_CLINIC_OR_DEPARTMENT_OTHER): Payer: Self-pay

## 2020-08-16 ENCOUNTER — Ambulatory Visit: Payer: BC Managed Care – PPO | Admitting: Cardiology

## 2020-09-22 DIAGNOSIS — G518 Other disorders of facial nerve: Secondary | ICD-10-CM | POA: Diagnosis not present

## 2020-09-22 DIAGNOSIS — H9202 Otalgia, left ear: Secondary | ICD-10-CM | POA: Diagnosis not present

## 2020-09-22 DIAGNOSIS — H9193 Unspecified hearing loss, bilateral: Secondary | ICD-10-CM | POA: Diagnosis not present

## 2020-10-16 DIAGNOSIS — R351 Nocturia: Secondary | ICD-10-CM | POA: Diagnosis not present

## 2020-10-16 DIAGNOSIS — N401 Enlarged prostate with lower urinary tract symptoms: Secondary | ICD-10-CM | POA: Diagnosis not present

## 2020-10-16 DIAGNOSIS — R3914 Feeling of incomplete bladder emptying: Secondary | ICD-10-CM | POA: Diagnosis not present

## 2020-10-17 DIAGNOSIS — M26622 Arthralgia of left temporomandibular joint: Secondary | ICD-10-CM | POA: Diagnosis not present

## 2020-10-17 DIAGNOSIS — H9203 Otalgia, bilateral: Secondary | ICD-10-CM | POA: Diagnosis not present

## 2020-11-09 DIAGNOSIS — R351 Nocturia: Secondary | ICD-10-CM | POA: Diagnosis not present

## 2020-11-09 DIAGNOSIS — N401 Enlarged prostate with lower urinary tract symptoms: Secondary | ICD-10-CM | POA: Diagnosis not present

## 2020-11-10 DIAGNOSIS — Z23 Encounter for immunization: Secondary | ICD-10-CM | POA: Diagnosis not present

## 2020-11-10 DIAGNOSIS — K219 Gastro-esophageal reflux disease without esophagitis: Secondary | ICD-10-CM | POA: Diagnosis not present

## 2020-11-10 DIAGNOSIS — Z1159 Encounter for screening for other viral diseases: Secondary | ICD-10-CM | POA: Diagnosis not present

## 2020-11-10 DIAGNOSIS — L299 Pruritus, unspecified: Secondary | ICD-10-CM | POA: Diagnosis not present

## 2020-11-10 DIAGNOSIS — D6869 Other thrombophilia: Secondary | ICD-10-CM | POA: Diagnosis not present

## 2020-11-10 DIAGNOSIS — N400 Enlarged prostate without lower urinary tract symptoms: Secondary | ICD-10-CM | POA: Diagnosis not present

## 2020-11-10 DIAGNOSIS — Z125 Encounter for screening for malignant neoplasm of prostate: Secondary | ICD-10-CM | POA: Diagnosis not present

## 2020-11-10 DIAGNOSIS — Z0000 Encounter for general adult medical examination without abnormal findings: Secondary | ICD-10-CM | POA: Diagnosis not present

## 2020-11-10 DIAGNOSIS — Z86711 Personal history of pulmonary embolism: Secondary | ICD-10-CM | POA: Diagnosis not present

## 2020-11-10 DIAGNOSIS — L918 Other hypertrophic disorders of the skin: Secondary | ICD-10-CM | POA: Diagnosis not present

## 2020-11-10 DIAGNOSIS — I10 Essential (primary) hypertension: Secondary | ICD-10-CM | POA: Diagnosis not present

## 2020-11-14 ENCOUNTER — Other Ambulatory Visit (HOSPITAL_BASED_OUTPATIENT_CLINIC_OR_DEPARTMENT_OTHER): Payer: Self-pay

## 2020-12-06 DIAGNOSIS — L820 Inflamed seborrheic keratosis: Secondary | ICD-10-CM | POA: Diagnosis not present

## 2020-12-06 DIAGNOSIS — L720 Epidermal cyst: Secondary | ICD-10-CM | POA: Diagnosis not present

## 2020-12-06 DIAGNOSIS — L918 Other hypertrophic disorders of the skin: Secondary | ICD-10-CM | POA: Diagnosis not present

## 2020-12-18 DIAGNOSIS — R351 Nocturia: Secondary | ICD-10-CM | POA: Diagnosis not present

## 2020-12-18 DIAGNOSIS — N401 Enlarged prostate with lower urinary tract symptoms: Secondary | ICD-10-CM | POA: Diagnosis not present

## 2020-12-18 DIAGNOSIS — R3912 Poor urinary stream: Secondary | ICD-10-CM | POA: Diagnosis not present

## 2020-12-27 ENCOUNTER — Ambulatory Visit: Payer: Self-pay | Attending: Internal Medicine

## 2020-12-27 DIAGNOSIS — Z23 Encounter for immunization: Secondary | ICD-10-CM

## 2020-12-27 NOTE — Progress Notes (Signed)
   Covid-19 Vaccination Clinic  Name:  KEONTAE LEVINGSTON    MRN: [id number] DOB: Jan 05, 1956  12/27/2020  Mr. Morgano was observed post Covid-19 immunization for 15 minutes without incident. He was provided with Vaccine Information Sheet and instruction to access the V-Safe system.   Mr. Gough was instructed to call 911 with any severe reactions post vaccine: Difficulty breathing  Swelling of face and throat  A fast heartbeat  A bad rash all over body  Dizziness and weakness

## 2021-01-03 DIAGNOSIS — H16142 Punctate keratitis, left eye: Secondary | ICD-10-CM | POA: Diagnosis not present

## 2021-01-18 ENCOUNTER — Encounter: Payer: Self-pay | Admitting: Cardiology

## 2021-01-18 ENCOUNTER — Other Ambulatory Visit: Payer: Self-pay

## 2021-01-18 ENCOUNTER — Other Ambulatory Visit (HOSPITAL_BASED_OUTPATIENT_CLINIC_OR_DEPARTMENT_OTHER): Payer: Self-pay

## 2021-01-18 ENCOUNTER — Ambulatory Visit: Payer: Medicare PPO | Admitting: Cardiology

## 2021-01-18 DIAGNOSIS — I83013 Varicose veins of right lower extremity with ulcer of ankle: Secondary | ICD-10-CM

## 2021-01-18 DIAGNOSIS — D6851 Activated protein C resistance: Secondary | ICD-10-CM

## 2021-01-18 DIAGNOSIS — I493 Ventricular premature depolarization: Secondary | ICD-10-CM | POA: Diagnosis not present

## 2021-01-18 DIAGNOSIS — N471 Phimosis: Secondary | ICD-10-CM | POA: Diagnosis not present

## 2021-01-18 DIAGNOSIS — R3912 Poor urinary stream: Secondary | ICD-10-CM | POA: Diagnosis not present

## 2021-01-18 DIAGNOSIS — N401 Enlarged prostate with lower urinary tract symptoms: Secondary | ICD-10-CM | POA: Diagnosis not present

## 2021-01-18 DIAGNOSIS — I10 Essential (primary) hypertension: Secondary | ICD-10-CM

## 2021-01-18 MED ORDER — PFIZER COVID-19 VAC BIVALENT 30 MCG/0.3ML IM SUSP
INTRAMUSCULAR | 0 refills | Status: AC
  Filled 2021-01-18: qty 0.3, 1d supply, fill #0

## 2021-01-18 MED ORDER — LOSARTAN POTASSIUM 25 MG PO TABS: 12.5000 mg | ORAL_TABLET | Freq: Every day | ORAL | 3 refills | Status: DC

## 2021-01-18 NOTE — Assessment & Plan Note (Signed)
EKG today shows fairly frequent PVCs unifocal.  No real change.  Echocardiogram previously showed normal EF.  No evidence of cardiomyopathy.

## 2021-01-18 NOTE — Assessment & Plan Note (Signed)
Blood pressures are now mildly elevated.  Dizziness seems to be associated with normotension at this point.  We not seeing blood pressures in the 90s anymore.  I would like for him to trial losartan 12.5 mg daily.  Previously on the 25 his blood pressure did get quite low.  We will see him back in 6 months.

## 2021-01-18 NOTE — Progress Notes (Signed)
Cardiology Office Note:    Date:  01/18/2021   ID:  Todd Gardner, DOB 23-Feb-1956, MRN [id number]  PCP:  Tally Joe, MD  [facility] Cardiologist:  Donato Schultz, MD  [facility] Electrophysiologist:  None   Referring MD: Tally Joe, MD     History of Present Illness:    Todd Gardner is a 65 y.o. male here for a follow up for hyperlipidemia, hypertension, and PVCs.   At previous visit he was showing me blood pressure readings in the 90s.  We decided to stop his losartan. He had some mild dizziness previously.  Previously blood pressure readings were noted.  We have decreased his losartan after retiring.  Midday dizziness  Echocardiogram in January 2020 showed normal EF with aortic valve sclerosis.  Was previously doing well.  No longer feeling dizzy.  Blood pressure was seen to be much better off of the losartan 25 mg a day.  He showed me several readings.  Sometimes he will get whitecoat hypertension in the office setting.  During Covid, he decided to retire from [employer].  His wife also retired as well.  He will be joining Medicare soon.  Has plans to travel [address] and the [facility].   Today:  Overall he appears well. He presents a blood pressure log. In office today his BP was 140/70 which is typical for him. His pressure is sometimes elevated.  His dizzy spells have become less noticeable, and whenever he feels ones coming on he checks his blood pressure. 4-5 months ago they were more frequently, but lately they have been more sporadic. His blood sugar normally goes down sometimes, and it could be a cause to his dizzy spells.  Started Silodosin in May this year.  He denies any palpitations, chest pain, or shortness of breath. No headaches, syncope, orthopnea, PND, lower extremity edema or exertional symptoms.\   Past Medical History:  Diagnosis Date   Clotting disorder (HCC)    Dysrhythmia    PVC's   Factor V Leiden  (HCC)    positive   Hemorrhoids    Hernia    History of blood clots    Pulmonary embolism   Hypertension    Jock itch 12-20-11   mild case at present   Poor circulation    Pulmonary embolism (HCC) 1997   Ulcer    leg   Wears glasses     Past Surgical History:  Procedure Laterality Date   HAND SURGERY     Left   HEMORRHOID SURGERY N/A 09/02/2019   Procedure: HEMORRHOIDECTOMY, HEMORRHOIDAL LIGATION/PEXY;  Surgeon: Karie Soda, MD;  Location: [facility];  Service: General;  Laterality: N/A;   INGUINAL HERNIA REPAIR  12/23/2011   Procedure: HERNIA REPAIR INGUINAL ADULT;  Surgeon: Adolph Pollack, MD;  Location: [facility];  Service: General;  Laterality: Left;  Left Inguinal Hernia with Mesh   RECTAL EXAM UNDER ANESTHESIA N/A 09/02/2019   Procedure: ANORECTAL EXAM UNDER ANESTHESIA;  Surgeon: Karie Soda, MD;  Location: [facility];  Service: General;  Laterality: N/A;   teeth extraction  01-01-2011  3 teeth extracted by oral surgeon   vein ablation surgery  8/12 and 12/08   vericose vein surgery  1997   R leg    Current Medications: Current Meds  Medication Sig   COVID-19 mRNA Vac-TriS, Pfizer, (PFIZER-BIONT COVID-19 VAC-TRIS) SUSP injection Inject into the muscle.   COVID-19 mRNA vaccine, Pfizer, 30 MCG/0.3ML injection AS DIRECTED   ELIQUIS  5 MG TABS tablet Take 5 mg by mouth 2 (two) times daily.   loratadine (CLARITIN) 10 MG tablet Take 10 mg by mouth daily as needed for allergies.    losartan (COZAAR) 25 MG tablet Take 0.5 tablets (12.5 mg total) by mouth daily.   Multiple Vitamins-Minerals (MULTIVITAMIN WITH MINERALS) tablet Take 1 tablet by mouth daily.   omeprazole (PRILOSEC) 40 MG capsule Take 40 mg by mouth 2 (two) times daily.   oxyCODONE (OXY IR/ROXICODONE) 5 MG immediate release tablet Take 1-2 tablets (5-10 mg total) by mouth every 6 (six) hours as needed for moderate pain, severe pain or breakthrough pain.   silodosin (RAPAFLO) 8 MG CAPS capsule Take 8 mg by mouth daily.      Allergies:   Lidocaine   Social History   Socioeconomic History   Marital status: Married    Spouse name: Not on file   Number of children: Y   Years of education: Not on file   Highest education level: Not on file  Occupational History   Occupation: Firefighter: [employer]  Tobacco Use   Smoking status: Never   Smokeless tobacco: Never  Vaping Use   Vaping Use: Never used  Substance and Sexual Activity   Alcohol use: Yes    Alcohol/week: 2.0 standard drinks    Types: 2 Cans of beer per week    Comment: 3 dks per week   Drug use: No   Sexual activity: Yes  Other Topics Concern   Not on file  Social History Narrative   Moved here from [place].   Social Determinants of Health   Financial Resource Strain: Not on file  Food Insecurity: Not on file  Transportation Needs: Not on file  Physical Activity: Not on file  Stress: Not on file  Social Connections: Not on file     Family History: The patient's family history includes Cancer in his maternal grandfather; Colon cancer in his sister.  ROS:   Please see the history of present illness.    (+) Dizziness  All other systems reviewed and are negative.  EKGs/Labs/Other Studies Reviewed:    The following studies were reviewed today: Blood pressure readings  EKG: EKG is personally reviewed and interpreted.  01/18/2021: Sinus Rhythm, PVCs , Rate 86 bpm   Recent Labs: No results found for requested labs within last 8760 hours.  Recent Lipid Panel No results found for: CHOL, TRIG, HDL, CHOLHDL, VLDL, LDLCALC, LDLDIRECT   Risk Assessment/Calculations:       Physical Exam:    VS:  BP 140/70 (BP Location: Left Arm, Patient Position: Sitting, Cuff Size: Normal)   Pulse 86   Ht 6\' 3"  (1.905 m)   Wt 211 lb (95.7 kg)   SpO2 94%   BMI 26.37 kg/m     Wt Readings from Last 3 Encounters:  01/18/21 211 lb (95.7 kg)  02/28/20 207 lb (93.9 kg)  12/30/19 201 lb (91.2 kg)      GEN:  Well nourished, well developed in no acute distress HEENT: Normal NECK: No JVD; No carotid bruits LYMPHATICS: No lymphadenopathy CARDIAC: Regular rhythm with Ectopy, no murmurs, rubs, gallops RESPIRATORY:  Clear to auscultation without rales, wheezing or rhonchi  ABDOMEN: Soft, non-tender, non-distended MUSCULOSKELETAL:  No edema; No deformity  SKIN: Warm and dry NEUROLOGIC:  Alert and oriented x 3 PSYCHIATRIC:  Normal affect   ASSESSMENT:    1. PVC's (premature ventricular contractions)   2. Factor V Leiden (  HCC)   3. Varicose veins of right lower extremity with ulcer of ankle (CODE) (HCC)   4. Essential hypertension     PLAN:    In order of problems listed above:  PVC's (premature ventricular contractions) EKG today shows fairly frequent PVCs unifocal.  No real change.  Echocardiogram previously showed normal EF.  No evidence of cardiomyopathy.  Factor V Leiden (HCC) Continuing with Eliquis anticoagulation.  Varicose veins of right lower extremity with ulcer of ankle (CODE) (HCC) Vascular note previously reviewed.  Radiofrequency ablation previously.  TED hose right greater than left varicose veins.  Essential hypertension Blood pressures are now mildly elevated.  Dizziness seems to be associated with normotension at this point.  We not seeing blood pressures in the 90s anymore.  I would like for him to trial losartan 12.5 mg daily.  Previously on the 25 his blood pressure did get quite low.  We will see him back in 6 months.   We discussed optimal blood pressure goals again.       Medication Adjustments/Labs and Tests Ordered: Current medicines are reviewed at length with the patient today.  Concerns regarding medicines are outlined above.  Orders Placed This Encounter  Procedures   EKG 12-Lead    Meds ordered this encounter  Medications   losartan (COZAAR) 25 MG tablet    Sig: Take 0.5 tablets (12.5 mg total) by mouth daily.    Dispense:  90 tablet     Refill:  3     Patient Instructions  Medication Instructions:  Please start Losartan 25 mg - take 1/2 tablet daily. Continue all other medications as listed.  *If you need a refill on your cardiac medications before your next appointment, please call your pharmacy*  Follow-Up: At [facility], you and your health needs are our priority.  As part of our continuing mission to provide you with exceptional heart care, we have created designated Provider Care Teams.  These Care Teams include your primary Cardiologist (physician) and Advanced Practice Providers (APPs -  Physician Assistants and Nurse Practitioners) who all work together to provide you with the care you need, when you need it.  We recommend signing up for the patient portal called "MyChart".  Sign up information is provided on this After Visit Summary.  MyChart is used to connect with patients for Virtual Visits (Telemedicine).  Patients are able to view lab/test results, encounter notes, upcoming appointments, etc.  Non-urgent messages can be sent to your provider as well.   To learn more about what you can do with MyChart, go to [email or web address].    Your next appointment:   6 month(s)  The format for your next appointment:   In Person  Provider:   Donato Schultz, MD   Thank you for choosing [facility]!!      I,Mathew Stumpf,acting as a scribe for Donato Schultz, MD.,have documented all relevant documentation on the behalf of Donato Schultz, MD,as directed by  Donato Schultz, MD while in the presence of Donato Schultz, MD.   I, Donato Schultz, MD, have reviewed all documentation for this visit. The documentation on 01/18/21 for the exam, diagnosis, procedures, and orders are all accurate and complete.   Signed, Donato Schultz, MD  01/18/2021 1:03 PM    [facility]

## 2021-01-18 NOTE — Assessment & Plan Note (Signed)
Continuing with Eliquis anticoagulation.

## 2021-01-18 NOTE — Assessment & Plan Note (Signed)
Vascular note previously reviewed.  Radiofrequency ablation previously.  TED hose right greater than left varicose veins.

## 2021-01-18 NOTE — Patient Instructions (Signed)
Medication Instructions:  Please start Losartan 25 mg - take 1/2 tablet daily. Continue all other medications as listed.  *If you need a refill on your cardiac medications before your next appointment, please call your pharmacy*  Follow-Up: At [facility], you and your health needs are our priority.  As part of our continuing mission to provide you with exceptional heart care, we have created designated Provider Care Teams.  These Care Teams include your primary Cardiologist (physician) and Advanced Practice Providers (APPs -  Physician Assistants and Nurse Practitioners) who all work together to provide you with the care you need, when you need it.  We recommend signing up for the patient portal called "MyChart".  Sign up information is provided on this After Visit Summary.  MyChart is used to connect with patients for Virtual Visits (Telemedicine).  Patients are able to view lab/test results, encounter notes, upcoming appointments, etc.  Non-urgent messages can be sent to your provider as well.   To learn more about what you can do with MyChart, go to [email or web address].    Your next appointment:   6 month(s)  The format for your next appointment:   In Person  Provider:   Donato Schultz, MD   Thank you for choosing [facility]!!

## 2021-01-23 DIAGNOSIS — H43813 Vitreous degeneration, bilateral: Secondary | ICD-10-CM | POA: Diagnosis not present

## 2021-01-24 DIAGNOSIS — Z23 Encounter for immunization: Secondary | ICD-10-CM | POA: Diagnosis not present

## 2021-03-08 DIAGNOSIS — U071 COVID-19: Secondary | ICD-10-CM | POA: Diagnosis not present

## 2021-03-27 ENCOUNTER — Ambulatory Visit (HOSPITAL_BASED_OUTPATIENT_CLINIC_OR_DEPARTMENT_OTHER): Admission: RE | Admit: 2021-03-27 | Payer: Medicare PPO | Source: Home / Self Care | Admitting: Urology

## 2021-03-27 ENCOUNTER — Encounter (HOSPITAL_BASED_OUTPATIENT_CLINIC_OR_DEPARTMENT_OTHER): Admission: RE | Payer: Self-pay | Source: Home / Self Care

## 2021-03-27 SURGERY — CIRCUMCISION, ADULT
Anesthesia: Choice

## 2021-05-14 DIAGNOSIS — K219 Gastro-esophageal reflux disease without esophagitis: Secondary | ICD-10-CM | POA: Diagnosis not present

## 2021-05-14 DIAGNOSIS — N3281 Overactive bladder: Secondary | ICD-10-CM | POA: Diagnosis not present

## 2021-05-14 DIAGNOSIS — I83811 Varicose veins of right lower extremities with pain: Secondary | ICD-10-CM | POA: Diagnosis not present

## 2021-05-14 DIAGNOSIS — R152 Fecal urgency: Secondary | ICD-10-CM | POA: Diagnosis not present

## 2021-05-14 DIAGNOSIS — Z86711 Personal history of pulmonary embolism: Secondary | ICD-10-CM | POA: Diagnosis not present

## 2021-05-14 DIAGNOSIS — H9193 Unspecified hearing loss, bilateral: Secondary | ICD-10-CM | POA: Diagnosis not present

## 2021-05-14 DIAGNOSIS — N400 Enlarged prostate without lower urinary tract symptoms: Secondary | ICD-10-CM | POA: Diagnosis not present

## 2021-05-14 DIAGNOSIS — D6869 Other thrombophilia: Secondary | ICD-10-CM | POA: Diagnosis not present

## 2021-05-14 DIAGNOSIS — I10 Essential (primary) hypertension: Secondary | ICD-10-CM | POA: Diagnosis not present

## 2021-12-28 ENCOUNTER — Ambulatory Visit: Payer: Medicare PPO | Attending: Cardiology | Admitting: Cardiology

## 2021-12-28 ENCOUNTER — Encounter: Payer: Self-pay | Admitting: Cardiology

## 2021-12-28 VITALS — BP 120/70 | HR 71 | Ht 75.0 in | Wt 206.0 lb

## 2021-12-28 DIAGNOSIS — D6851 Activated protein C resistance: Secondary | ICD-10-CM

## 2021-12-28 DIAGNOSIS — I493 Ventricular premature depolarization: Secondary | ICD-10-CM

## 2021-12-28 DIAGNOSIS — I10 Essential (primary) hypertension: Secondary | ICD-10-CM | POA: Diagnosis not present

## 2021-12-28 NOTE — Progress Notes (Unsigned)
Cardiology Office Note:    Date:  12/30/2021   ID:  Todd Gardner, DOB Jul 03, 1955, MRN [id number]  PCP:  Todd Contras, MD  [facility] Cardiologist:  Candee Furbish, MD  [facility] Electrophysiologist:  None   Referring MD: Todd Contras, MD     History of Present Illness:    Todd Gardner is a 66 y.o. male here for a follow up for hyperlipidemia, hypertension, and PVCs.   At previous visit he was showing me blood pressure readings in the 90s.  We decided to stop his losartan. He had some mild dizziness previously.  Previously blood pressure readings were noted.  We have decreased his losartan after retiring.  He is taking 12.5 losartan.  No significant midday dizziness  Echocardiogram in January 2020 showed normal EF with aortic valve sclerosis.  Was previously doing well.  No longer feeling dizzy.  Blood pressure was seen to be much better off of the losartan 25 mg a day.  He showed me several readings.  Sometimes he will get whitecoat hypertension in the office setting.  Felt shakey at times, checks blood pressure, sugar. Fiber in diet.  During Covid, he decided to retire from [employer].  His wife also retired as well.     Today:  Overall doing well.  Enjoying retirement.  Occasionally will have a shaky type spell.  Blood sugar check, blood pressure check.  Overall reasonable.  On Silodosin (Rapaflo)  He denies any palpitations, chest pain, or shortness of breath. No headaches, syncope, orthopnea, PND, lower extremity edema or exertional symptoms.\   Past Medical History:  Diagnosis Date   Clotting disorder ([place])    Dysrhythmia    PVC's   Factor V Leiden ([place])    positive   Hemorrhoids    Hernia    History of blood clots    Pulmonary embolism   Hypertension    Jock itch 12-20-11   mild case at present   Poor circulation    Pulmonary embolism ([place]) 1997   Ulcer    leg   Wears glasses     Past Surgical History:   Procedure Laterality Date   HAND SURGERY     Left   HEMORRHOID SURGERY N/A 09/02/2019   Procedure: HEMORRHOIDECTOMY, HEMORRHOIDAL LIGATION/PEXY;  Surgeon: Michael Boston, MD;  Location: [facility];  Service: General;  Laterality: N/A;   INGUINAL HERNIA REPAIR  12/23/2011   Procedure: HERNIA REPAIR INGUINAL ADULT;  Surgeon: Odis Hollingshead, MD;  Location: [facility];  Service: General;  Laterality: Left;  Left Inguinal Hernia with Mesh   RECTAL EXAM UNDER ANESTHESIA N/A 09/02/2019   Procedure: ANORECTAL EXAM UNDER ANESTHESIA;  Surgeon: Michael Boston, MD;  Location: [facility];  Service: General;  Laterality: N/A;   teeth extraction  01-01-2011  3 teeth extracted by oral surgeon   vein ablation surgery  8/12 and 12/08   vericose vein surgery  1997   R leg    Current Medications: Current Meds  Medication Sig   COVID-19 mRNA bivalent vaccine, Pfizer, (PFIZER COVID-19 VAC BIVALENT) injection Inject into the muscle.   COVID-19 mRNA Vac-TriS, Pfizer, (PFIZER-BIONT COVID-19 VAC-TRIS) SUSP injection Inject into the muscle.   ELIQUIS 5 MG TABS tablet Take 5 mg by mouth 2 (two) times daily.   loratadine (CLARITIN) 10 MG tablet Take 10 mg by mouth daily as needed for allergies.    losartan (COZAAR) 25 MG tablet Take 0.5 tablets (12.5 mg total) by mouth daily.  Multiple Vitamins-Minerals (MULTIVITAMIN WITH MINERALS) tablet Take 1 tablet by mouth daily.   omeprazole (PRILOSEC) 40 MG capsule Take 40 mg by mouth 2 (two) times daily.   oxyCODONE (OXY IR/ROXICODONE) 5 MG immediate release tablet Take 1-2 tablets (5-10 mg total) by mouth every 6 (six) hours as needed for moderate pain, severe pain or breakthrough pain.   silodosin (RAPAFLO) 8 MG CAPS capsule Take 8 mg by mouth daily.     Allergies:   Lidocaine   Social History   Socioeconomic History   Marital status: Married    Spouse name: Not on file   Number of children: Y   Years of education: Not on file   Highest education level: Not on file   Occupational History   Occupation: Firefighter: [employer]  Tobacco Use   Smoking status: Never   Smokeless tobacco: Never  Vaping Use   Vaping Use: Never used  Substance and Sexual Activity   Alcohol use: Yes    Alcohol/week: 2.0 standard drinks of alcohol    Types: 2 Cans of beer per week    Comment: 3 dks per week   Drug use: No   Sexual activity: Yes  Other Topics Concern   Not on file  Social History Narrative   Moved here from [place].   Social Determinants of Health   Financial Resource Strain: Not on file  Food Insecurity: Not on file  Transportation Needs: Not on file  Physical Activity: Not on file  Stress: Not on file  Social Connections: Not on file     Family History: The patient's family history includes Cancer in his maternal grandfather; Colon cancer in his sister.  ROS:   Please see the history of present illness.      All other systems reviewed and are negative.  EKGs/Labs/Other Studies Reviewed:    The following studies were reviewed today: Blood pressure readings  EKG: EKG is personally reviewed and interpreted.  01/18/2021: Sinus Rhythm, PVCs , Rate 86 bpm   Recent Labs: No results found for requested labs within last 365 days.  Recent Lipid Panel No results found for: "CHOL", "TRIG", "HDL", "CHOLHDL", "VLDL", "LDLCALC", "LDLDIRECT"   Risk Assessment/Calculations:       Physical Exam:    VS:  BP 120/70 (BP Location: Left Arm, Patient Position: Sitting, Cuff Size: Normal)   Pulse 71   Ht 6\' 3"  (1.905 m)   Wt 206 lb (93.4 kg)   BMI 25.75 kg/m     Wt Readings from Last 3 Encounters:  12/28/21 206 lb (93.4 kg)  01/18/21 211 lb (95.7 kg)  02/28/20 207 lb (93.9 kg)     GEN:  Well nourished, well developed in no acute distress HEENT: Normal NECK: No JVD; No carotid bruits LYMPHATICS: No lymphadenopathy CARDIAC: Regular rhythm with Ectopy, no murmurs, rubs, gallops RESPIRATORY:  Clear to  auscultation without rales, wheezing or rhonchi  ABDOMEN: Soft, non-tender, non-distended MUSCULOSKELETAL:  No edema; No deformity  SKIN: Warm and dry NEUROLOGIC:  Alert and oriented x 3 PSYCHIATRIC:  Normal affect   ASSESSMENT:    1. Essential hypertension   2. Factor V Leiden (HCC)   3. PVC's (premature ventricular contractions)      PLAN:    In order of problems listed above:    PVC's (premature ventricular contractions) EKG  previously showed fairly frequent PVCs unifocal.  Today, EKG does not have any PVCs.    Echocardiogram previously showed normal EF.  No evidence of cardiomyopathy.   Factor V Leiden (HCC) Continuing with Eliquis anticoagulation.  Doing very well.  Seen last hair fall out after stopping warfarin.   Varicose veins of right lower extremity with ulcer of ankle (CODE) (HCC) Vascular note previously reviewed.  Radiofrequency ablation previously.  TED hose right greater than left varicose veins.  Stable   Essential hypertension Blood pressures are now mildly elevated.  Dizziness seems to be associated with normotension at this point.  We not seeing blood pressures in the 90s anymore.  I would like for him to continue losartan 12.5 mg daily.  Previously on the 25 his blood pressure did get quite low.    We discussed optimal blood pressure goals again.       Medication Adjustments/Labs and Tests Ordered: Current medicines are reviewed at length with the patient today.  Concerns regarding medicines are outlined above.  Orders Placed This Encounter  Procedures   EKG 12-Lead    No orders of the defined types were placed in this encounter.    Patient Instructions  Medication Instructions:  The current medical regimen is effective;  continue present plan and medications.  *If you need a refill on your cardiac medications before your next appointment, please call your pharmacy*  Follow-Up: At [facility], you and your health needs are our  priority.  As part of our continuing mission to provide you with exceptional heart care, we have created designated Provider Care Teams.  These Care Teams include your primary Cardiologist (physician) and Advanced Practice Providers (APPs -  Physician Assistants and Nurse Practitioners) who all work together to provide you with the care you need, when you need it.  We recommend signing up for the patient portal called "MyChart".  Sign up information is provided on this After Visit Summary.  MyChart is used to connect with patients for Virtual Visits (Telemedicine).  Patients are able to view lab/test results, encounter notes, upcoming appointments, etc.  Non-urgent messages can be sent to your provider as well.   To learn more about what you can do with MyChart, go to [email or web address].    Your next appointment:   1 year(s)  The format for your next appointment:   In Person  Provider:   Donato Schultz, MD      Important Information About Sugar          I,Mathew Stumpf,acting as a scribe for Donato Schultz, MD.,have documented all relevant documentation on the behalf of Donato Schultz, MD,as directed by  Donato Schultz, MD while in the presence of Donato Schultz, MD.   I, Donato Schultz, MD, have reviewed all documentation for this visit. The documentation on 12/30/21 for the exam, diagnosis, procedures, and orders are all accurate and complete.   Signed, Donato Schultz, MD  12/30/2021 9:17 AM    [facility]

## 2021-12-28 NOTE — Patient Instructions (Signed)
Medication Instructions:  The current medical regimen is effective;  continue present plan and medications.  *If you need a refill on your cardiac medications before your next appointment, please call your pharmacy*  Follow-Up: At [facility], you and your health needs are our priority.  As part of our continuing mission to provide you with exceptional heart care, we have created designated Provider Care Teams.  These Care Teams include your primary Cardiologist (physician) and Advanced Practice Providers (APPs -  Physician Assistants and Nurse Practitioners) who all work together to provide you with the care you need, when you need it.  We recommend signing up for the patient portal called "MyChart".  Sign up information is provided on this After Visit Summary.  MyChart is used to connect with patients for Virtual Visits (Telemedicine).  Patients are able to view lab/test results, encounter notes, upcoming appointments, etc.  Non-urgent messages can be sent to your provider as well.   To learn more about what you can do with MyChart, go to [email or web address].    Your next appointment:   1 year(s)  The format for your next appointment:   In Person  Provider:   Mark Skains, MD      Important Information About Sugar       

## 2022-01-11 DIAGNOSIS — Z125 Encounter for screening for malignant neoplasm of prostate: Secondary | ICD-10-CM | POA: Diagnosis not present

## 2022-01-11 DIAGNOSIS — Z86711 Personal history of pulmonary embolism: Secondary | ICD-10-CM | POA: Diagnosis not present

## 2022-01-11 DIAGNOSIS — D6869 Other thrombophilia: Secondary | ICD-10-CM | POA: Diagnosis not present

## 2022-01-11 DIAGNOSIS — Z0000 Encounter for general adult medical examination without abnormal findings: Secondary | ICD-10-CM | POA: Diagnosis not present

## 2022-01-11 DIAGNOSIS — E7800 Pure hypercholesterolemia, unspecified: Secondary | ICD-10-CM | POA: Diagnosis not present

## 2022-01-11 DIAGNOSIS — K219 Gastro-esophageal reflux disease without esophagitis: Secondary | ICD-10-CM | POA: Diagnosis not present

## 2022-01-11 DIAGNOSIS — Z1331 Encounter for screening for depression: Secondary | ICD-10-CM | POA: Diagnosis not present

## 2022-01-11 DIAGNOSIS — R152 Fecal urgency: Secondary | ICD-10-CM | POA: Diagnosis not present

## 2022-01-11 DIAGNOSIS — R053 Chronic cough: Secondary | ICD-10-CM | POA: Diagnosis not present

## 2022-01-11 DIAGNOSIS — I10 Essential (primary) hypertension: Secondary | ICD-10-CM | POA: Diagnosis not present

## 2022-01-16 DIAGNOSIS — R151 Fecal smearing: Secondary | ICD-10-CM | POA: Diagnosis not present

## 2022-01-16 DIAGNOSIS — Z9889 Other specified postprocedural states: Secondary | ICD-10-CM | POA: Diagnosis not present

## 2022-01-18 ENCOUNTER — Other Ambulatory Visit (HOSPITAL_BASED_OUTPATIENT_CLINIC_OR_DEPARTMENT_OTHER): Payer: Self-pay

## 2022-01-18 MED ORDER — COMIRNATY 30 MCG/0.3ML IM SUSY
PREFILLED_SYRINGE | INTRAMUSCULAR | 0 refills | Status: AC
  Filled 2022-01-18: qty 0.3, 1d supply, fill #0

## 2022-01-30 ENCOUNTER — Other Ambulatory Visit: Payer: Self-pay | Admitting: Cardiology

## 2022-02-15 ENCOUNTER — Other Ambulatory Visit (HOSPITAL_BASED_OUTPATIENT_CLINIC_OR_DEPARTMENT_OTHER): Payer: Self-pay

## 2022-02-15 MED ORDER — FLUAD QUADRIVALENT 0.5 ML IM PRSY
PREFILLED_SYRINGE | INTRAMUSCULAR | 0 refills | Status: AC
  Filled 2022-02-15: qty 0.5, 1d supply, fill #0

## 2022-02-21 DIAGNOSIS — R3912 Poor urinary stream: Secondary | ICD-10-CM | POA: Diagnosis not present

## 2022-02-21 DIAGNOSIS — N471 Phimosis: Secondary | ICD-10-CM | POA: Diagnosis not present

## 2022-02-21 DIAGNOSIS — N401 Enlarged prostate with lower urinary tract symptoms: Secondary | ICD-10-CM | POA: Diagnosis not present

## 2022-03-05 ENCOUNTER — Other Ambulatory Visit: Payer: Self-pay | Admitting: *Deleted

## 2022-03-05 DIAGNOSIS — I872 Venous insufficiency (chronic) (peripheral): Secondary | ICD-10-CM

## 2022-03-05 DIAGNOSIS — I83891 Varicose veins of right lower extremities with other complications: Secondary | ICD-10-CM

## 2022-03-14 DIAGNOSIS — R040 Epistaxis: Secondary | ICD-10-CM | POA: Diagnosis not present

## 2022-03-21 ENCOUNTER — Ambulatory Visit: Payer: Medicare PPO | Admitting: Vascular Surgery

## 2022-03-21 ENCOUNTER — Encounter: Payer: Self-pay | Admitting: Vascular Surgery

## 2022-03-21 ENCOUNTER — Ambulatory Visit (HOSPITAL_COMMUNITY)
Admission: RE | Admit: 2022-03-21 | Discharge: 2022-03-21 | Disposition: A | Discharge status: Home / Self Care | Payer: Medicare PPO | Source: Ambulatory Visit | Attending: Vascular Surgery | Admitting: Vascular Surgery

## 2022-03-21 VITALS — BP 161/103 | HR 82 | Temp 98.0°F | Resp 20 | Ht 75.0 in | Wt 212.0 lb

## 2022-03-21 DIAGNOSIS — I83891 Varicose veins of right lower extremities with other complications: Secondary | ICD-10-CM | POA: Insufficient documentation

## 2022-03-21 DIAGNOSIS — I872 Venous insufficiency (chronic) (peripheral): Secondary | ICD-10-CM | POA: Insufficient documentation

## 2022-03-21 NOTE — Progress Notes (Signed)
REASON FOR VISIT:   Follow-up of painful varicose veins of the right lower extremity  MEDICAL ISSUES:   CHRONIC VENOUS INSUFFICIENCY: This patient has CEAP C5 venous disease (previous venous ulcer which is now healed).  He has painful varicose veins of the right lower extremity which are being fed by branches off the saphenofemoral junction.  I do not see a straight segment amenable to laser angioplasty.  We have discussed the importance of leg elevation.  He has a problem with this because of hip arthritis.  He also has a problem getting on thigh-high stockings because of his hip arthritis.  In addition during the summer months he does not tolerate stockings as they are too hot for him.  I encouraged him to stay as active as possible.  If his symptoms progress I think the only option would be high ligation of the right great saphenous vein and greater than 20 stab phlebectomies.  This would have to be done in the operating room.  We did discuss the indications of the procedure and the potential complications today.  He would like to consider this before making a decision.  In the meantime he will continue with conservative measures.  HPI:   Todd Gardner is a pleasant 67 y.o. male who I saw back in October 2021 with painful varicose veins of the right lower extremity.  We discussed conservative measures including leg elevation, compression therapy, and exercise.  I plan on seeing him back in 3 months and I felt that if his symptoms progress his only option would be high ligation of the right great saphenous vein with greater than 20 stabs.  This would have to be done in the operating room.  Since I saw him last his symptoms have not changed significantly.  He describes a sensation where he feels like the blood is rushing to the superficial veins from deeper down very similar to the symptoms he had 30 years ago when he first had a procedure done.  The symptoms have gradually progressed.  He notes  that the varicosities around his right knee have gradually gotten bigger.  He has had 2 DVTs previously in the right leg many years ago.  The first 1 was 27 years ago and the second was a few years later.  He is also had a pulmonary embolus in the remote past.  He has a factor V Leiden mutation.  He underwent endovenous laser ablation of the right great saphenous vein by Dr. Kellie Simmering in 2012 in 2017 according to the patient.  Past Medical History:  Diagnosis Date   Clotting disorder ([place])    Dysrhythmia    PVC's   Factor V Leiden ([place])    positive   Hemorrhoids    Hernia    History of blood clots    Pulmonary embolism   Hypertension    Jock itch 12-20-11   mild case at present   Poor circulation    Pulmonary embolism (HCC) 1997   Ulcer    leg   Wears glasses     Family History  Problem Relation Age of Onset   Colon cancer Sister    Cancer Maternal Grandfather        colon    SOCIAL HISTORY: Social History   Tobacco Use   Smoking status: Never   Smokeless tobacco: Never  Substance Use Topics   Alcohol use: Yes    Alcohol/week: 2.0 standard drinks of alcohol    Types: 2  Cans of beer per week    Comment: 3 dks per week    Allergies  Allergen Reactions   Lidocaine Other (See Comments)    Topical Burning sensation     Current Outpatient Medications  Medication Sig Dispense Refill   COVID-19 mRNA bivalent vaccine, Pfizer, (PFIZER COVID-19 VAC BIVALENT) injection Inject into the muscle. 0.3 mL 0   COVID-19 mRNA Vac-TriS, Pfizer, (PFIZER-BIONT COVID-19 VAC-TRIS) SUSP injection Inject into the muscle. 0.3 mL 0   COVID-19 mRNA vaccine 2023-2024 (COMIRNATY) syringe Inject into the muscle. 0.3 mL 0   ELIQUIS 5 MG TABS tablet Take 5 mg by mouth 2 (two) times daily.     influenza vaccine adjuvanted (FLUAD QUADRIVALENT) 0.5 ML injection Inject into the muscle. 0.5 mL 0   loratadine (CLARITIN) 10 MG tablet Take 10 mg by mouth daily as needed for allergies.      losartan  (COZAAR) 25 MG tablet TAKE 1/2 TABLET BY MOUTH EVERY DAY 45 tablet 3   Multiple Vitamins-Minerals (MULTIVITAMIN WITH MINERALS) tablet Take 1 tablet by mouth daily.     omeprazole (PRILOSEC) 40 MG capsule Take 40 mg by mouth 2 (two) times daily.     oxyCODONE (OXY IR/ROXICODONE) 5 MG immediate release tablet Take 1-2 tablets (5-10 mg total) by mouth every 6 (six) hours as needed for moderate pain, severe pain or breakthrough pain. 30 tablet 0   silodosin (RAPAFLO) 8 MG CAPS capsule Take 8 mg by mouth daily.     No current facility-administered medications for this visit.    REVIEW OF SYSTEMS:  [X]  denotes positive finding, [ ]  denotes negative finding Cardiac  Comments:  Chest pain or chest pressure:    Shortness of breath upon exertion:    Short of breath when lying flat:    Irregular heart rhythm: x       Vascular    Pain in calf, thigh, or hip brought on by ambulation:    Pain in feet at night that wakes you up from your sleep:     Blood clot in your veins:    Leg swelling:  x       Pulmonary    Oxygen at home:    Productive cough:     Wheezing:         Neurologic    Sudden weakness in arms or legs:     Sudden numbness in arms or legs:     Sudden onset of difficulty speaking or slurred speech:    Temporary loss of vision in one eye:     Problems with dizziness:         Gastrointestinal    Blood in stool:     Vomited blood:         Genitourinary    Burning when urinating:     Blood in urine:        Psychiatric    Major depression:         Hematologic    Bleeding problems:    Problems with blood clotting too easily:        Skin    Rashes or ulcers:        Constitutional    Fever or chills:     PHYSICAL EXAM:   Vitals:   03/21/22 1610  BP: (!) 161/103  Pulse: 82  Resp: 20  Temp: 98 F (36.7 C)  SpO2: 96%  Weight: 212 lb (96.2 kg)  Height: 6\' 3"  (1.905 m)    GENERAL: The patient  is a well-nourished male, in no acute distress. The vital signs are  documented above. CARDIAC: There is a regular rate and rhythm.  VASCULAR: I do not detect carotid bruits. He has palpable pedal pulses. He has markedly enlarged varicose veins along the medial aspect of his left thigh and also around the knee as documented in the photograph below.       I did look at his saphenofemoral junction.  He has some branches off the saphenofemoral junction which are feeding his varicosities in the thigh.  They are markedly tortuous and there is not a straight segment amenable to laser ablation.  He would require high ligation of the saphenofemoral junction and stab phlebectomies. PULMONARY: There is good air exchange bilaterally without wheezing or rales. ABDOMEN: Soft and non-tender with normal pitched bowel sounds.  MUSCULOSKELETAL: There are no major deformities or cyanosis. NEUROLOGIC: No focal weakness or paresthesias are detected. SKIN: There are no ulcers or rashes noted. PSYCHIATRIC: The patient has a normal affect.  DATA:    VENOUS DUPLEX: I have independently interpreted his venous duplex scan.  As this was of the right lower extremity only today.  There was no evidence of DVT.  There was deep venous reflux involving the common femoral vein and popliteal vein.  There was reflux and some branches off of the saphenofemoral femoral junction which were feeding the varicosities along his medial right thigh and right leg.  I suspect the large varicosities around the knee are being fed by a perforator.   A total of 42 minutes was spent on this visit. 24 minutes was face to face time. More than 50% of the time was spent on counseling and coordinating with the patient.    Deitra Mayo [facility] [phone]

## 2022-03-25 DIAGNOSIS — R3914 Feeling of incomplete bladder emptying: Secondary | ICD-10-CM | POA: Diagnosis not present

## 2022-03-26 DIAGNOSIS — R208 Other disturbances of skin sensation: Secondary | ICD-10-CM | POA: Diagnosis not present

## 2022-03-26 DIAGNOSIS — L821 Other seborrheic keratosis: Secondary | ICD-10-CM | POA: Diagnosis not present

## 2022-03-26 DIAGNOSIS — B070 Plantar wart: Secondary | ICD-10-CM | POA: Diagnosis not present

## 2022-03-26 DIAGNOSIS — I8311 Varicose veins of right lower extremity with inflammation: Secondary | ICD-10-CM | POA: Diagnosis not present

## 2022-04-01 DIAGNOSIS — R3912 Poor urinary stream: Secondary | ICD-10-CM | POA: Diagnosis not present

## 2022-04-01 DIAGNOSIS — R351 Nocturia: Secondary | ICD-10-CM | POA: Diagnosis not present

## 2022-04-23 DIAGNOSIS — R278 Other lack of coordination: Secondary | ICD-10-CM | POA: Diagnosis not present

## 2022-04-23 DIAGNOSIS — R151 Fecal smearing: Secondary | ICD-10-CM | POA: Diagnosis not present

## 2022-04-23 DIAGNOSIS — R351 Nocturia: Secondary | ICD-10-CM | POA: Diagnosis not present

## 2022-05-02 DIAGNOSIS — N401 Enlarged prostate with lower urinary tract symptoms: Secondary | ICD-10-CM | POA: Diagnosis not present

## 2022-05-02 DIAGNOSIS — R351 Nocturia: Secondary | ICD-10-CM | POA: Diagnosis not present

## 2022-05-29 DIAGNOSIS — R3914 Feeling of incomplete bladder emptying: Secondary | ICD-10-CM | POA: Diagnosis not present

## 2022-05-29 DIAGNOSIS — M6281 Muscle weakness (generalized): Secondary | ICD-10-CM | POA: Diagnosis not present

## 2022-05-29 DIAGNOSIS — H43813 Vitreous degeneration, bilateral: Secondary | ICD-10-CM | POA: Diagnosis not present

## 2022-05-29 DIAGNOSIS — R3912 Poor urinary stream: Secondary | ICD-10-CM | POA: Diagnosis not present

## 2022-05-29 DIAGNOSIS — R351 Nocturia: Secondary | ICD-10-CM | POA: Diagnosis not present

## 2022-05-29 DIAGNOSIS — M62838 Other muscle spasm: Secondary | ICD-10-CM | POA: Diagnosis not present

## 2022-06-13 DIAGNOSIS — N401 Enlarged prostate with lower urinary tract symptoms: Secondary | ICD-10-CM | POA: Diagnosis not present

## 2022-06-13 DIAGNOSIS — R3912 Poor urinary stream: Secondary | ICD-10-CM | POA: Diagnosis not present

## 2022-06-13 DIAGNOSIS — R351 Nocturia: Secondary | ICD-10-CM | POA: Diagnosis not present

## 2022-07-02 DIAGNOSIS — R151 Fecal smearing: Secondary | ICD-10-CM | POA: Diagnosis not present

## 2022-07-02 DIAGNOSIS — M62838 Other muscle spasm: Secondary | ICD-10-CM | POA: Diagnosis not present

## 2022-07-02 DIAGNOSIS — R351 Nocturia: Secondary | ICD-10-CM | POA: Diagnosis not present

## 2022-07-02 DIAGNOSIS — M6281 Muscle weakness (generalized): Secondary | ICD-10-CM | POA: Diagnosis not present

## 2022-07-09 DIAGNOSIS — E7800 Pure hypercholesterolemia, unspecified: Secondary | ICD-10-CM | POA: Diagnosis not present

## 2022-07-09 DIAGNOSIS — R202 Paresthesia of skin: Secondary | ICD-10-CM | POA: Diagnosis not present

## 2022-07-09 DIAGNOSIS — R5381 Other malaise: Secondary | ICD-10-CM | POA: Diagnosis not present

## 2022-07-09 DIAGNOSIS — R152 Fecal urgency: Secondary | ICD-10-CM | POA: Diagnosis not present

## 2022-07-09 DIAGNOSIS — N400 Enlarged prostate without lower urinary tract symptoms: Secondary | ICD-10-CM | POA: Diagnosis not present

## 2022-07-09 DIAGNOSIS — K219 Gastro-esophageal reflux disease without esophagitis: Secondary | ICD-10-CM | POA: Diagnosis not present

## 2022-07-09 DIAGNOSIS — I10 Essential (primary) hypertension: Secondary | ICD-10-CM | POA: Diagnosis not present

## 2022-07-09 DIAGNOSIS — M25552 Pain in left hip: Secondary | ICD-10-CM | POA: Diagnosis not present

## 2022-07-09 DIAGNOSIS — B079 Viral wart, unspecified: Secondary | ICD-10-CM | POA: Diagnosis not present

## 2022-08-07 ENCOUNTER — Ambulatory Visit: Payer: Medicare PPO | Admitting: Podiatry

## 2022-08-07 DIAGNOSIS — B070 Plantar wart: Secondary | ICD-10-CM | POA: Diagnosis not present

## 2022-08-07 MED ORDER — CLOTRIMAZOLE-BETAMETHASONE 1-0.05 % EX CREA: 1.0000 | TOPICAL_CREAM | Freq: Every day | CUTANEOUS | 0 refills | Status: AC

## 2022-08-07 NOTE — Progress Notes (Signed)
   Chief Complaint  Patient presents with   Plantar Warts    HPI: 67 year old male presenting today as a reestablish new patient for evaluation of symptomatic skin lesions to the bilateral feet.  He has a history of plantar verruca's bilateral.  He believes that this has returned.  He presents today for further treatment evaluation   Past Medical History:  Diagnosis Date   Clotting disorder (HCC)    Dysrhythmia    PVC's   Factor V Leiden (HCC)    positive   Hemorrhoids    Hernia    History of blood clots    Pulmonary embolism   Hypertension    Jock itch 12-20-11   mild case at present   Poor circulation    Pulmonary embolism ([facility]) 1997   Ulcer    leg   Wears glasses      Physical Exam: General: The patient is alert and oriented x3 in no acute distress.  Dermatology: Skin is warm, dry and supple bilateral lower extremities. Negative for open lesions or macerations.  Hyperkeratotic verruca lesions noted to the plantar aspect of the bilateral feet.  Vascular: Palpable pedal pulses bilaterally. No edema or erythema noted. Capillary refill within normal limits.  Neurological: Grossly intact via light touch.  Musculoskeletal Exam: Range of motion within normal limits to all pedal and ankle joints bilateral. Muscle strength 5/5 in all groups bilateral.   Assessment: 1. Plantar warts bilateral feet -Patient evaluated -Excisional debridement of the hyperkeratotic verruca tissue was performed today using a 312 scalpel.  Pinpoint bleeding noted upon debridement consistent with verruca.  Salicylic acid and a light dressing applied -Recommend OTC salicylic acid daily x 4 weeks -Return to clinic 4 weeks for follow-up  2.  Peripheral neuropathy lateral aspect of the feet bilateral -Patient believes that the symptoms may be nerve related.  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  He also has a history of  sciatica with lumbar pathology. -I do believe the patient's symptoms may be coming from his lumbar spine and sciatica.  His presentation is very symmetrical but can be more symptomatic on one side and vice versa -Patient is also inquiring about Lotrisone cream.  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 Felecia Shelling, DPM    [address] [place], [place] [address]                Office [phone]  Fax [phone]

## 2022-08-14 DIAGNOSIS — N401 Enlarged prostate with lower urinary tract symptoms: Secondary | ICD-10-CM | POA: Diagnosis not present

## 2022-08-14 DIAGNOSIS — R3912 Poor urinary stream: Secondary | ICD-10-CM | POA: Diagnosis not present

## 2022-08-19 DIAGNOSIS — R152 Fecal urgency: Secondary | ICD-10-CM | POA: Diagnosis not present

## 2022-08-19 DIAGNOSIS — M62838 Other muscle spasm: Secondary | ICD-10-CM | POA: Diagnosis not present

## 2022-08-19 DIAGNOSIS — R3912 Poor urinary stream: Secondary | ICD-10-CM | POA: Diagnosis not present

## 2022-08-19 DIAGNOSIS — R351 Nocturia: Secondary | ICD-10-CM | POA: Diagnosis not present

## 2022-08-19 DIAGNOSIS — M6281 Muscle weakness (generalized): Secondary | ICD-10-CM | POA: Diagnosis not present

## 2022-09-09 ENCOUNTER — Encounter: Payer: Self-pay | Admitting: Diagnostic Neuroimaging

## 2022-09-09 ENCOUNTER — Ambulatory Visit: Payer: Medicare PPO | Admitting: Diagnostic Neuroimaging

## 2022-09-09 VITALS — BP 152/78 | HR 76 | Ht 75.0 in | Wt 208.8 lb

## 2022-09-09 DIAGNOSIS — R351 Nocturia: Secondary | ICD-10-CM | POA: Diagnosis not present

## 2022-09-09 DIAGNOSIS — R200 Anesthesia of skin: Secondary | ICD-10-CM

## 2022-09-09 DIAGNOSIS — R292 Abnormal reflex: Secondary | ICD-10-CM

## 2022-09-09 DIAGNOSIS — R152 Fecal urgency: Secondary | ICD-10-CM | POA: Diagnosis not present

## 2022-09-09 DIAGNOSIS — R202 Paresthesia of skin: Secondary | ICD-10-CM

## 2022-09-09 NOTE — Progress Notes (Signed)
[facility]  PATIENT: Todd Gardner DOB: 1955/10/02  REFERRING CLINICIAN: Tally Joe, MD HISTORY FROM: PATIENT REASON FOR VISIT: NEW CONSULT   HISTORICAL  CHIEF COMPLAINT:  Chief Complaint  Patient presents with   New Patient (Initial Visit)    Patient in room #6 and alone. Patient states he has tingling in his feet.    HISTORY OF PRESENT ILLNESS:   67 year old male here for evaluation of numbness and tingling in toes.  Symptoms started about 4 months ago with numbness and tingling in the lateral toes bilaterally.  Symptoms worse at night and after standing for a while.  Symptoms are intermittent.  He initially went to dermatology for athlete's foot evaluation but this was ruled out.  Since then he has been to podiatrist for evaluation of plantar warts and treatment of that.  Patient also has some issues with enlarged prostate, nocturia and fecal urgency since 2022.  He has been to PCP and urology for this.  History of PE with factor V Leiden deficiency.  On anticoagulation.  Has some low back pain near the sacral region.  Has had some sciatica on the left side.  Has some issues with varicose veins and associated ulcers, evaluated by vascular surgery.  No issues with neck pain or upper extremities.   REVIEW OF SYSTEMS: Full 14 system review of systems performed and negative with exception of: as per HPI.  ALLERGIES: Allergies  Allergen Reactions   Lidocaine Other (See Comments)    Topical Burning sensation     HOME MEDICATIONS: Outpatient Medications Prior to Visit  Medication Sig Dispense Refill   clotrimazole-betamethasone (LOTRISONE) cream Apply 1 Application topically daily. 30 g 0   COVID-19 mRNA bivalent vaccine, Pfizer, (PFIZER COVID-19 VAC BIVALENT) injection Inject into the muscle. 0.3 mL 0   COVID-19 mRNA Vac-TriS, Pfizer, (PFIZER-BIONT COVID-19 VAC-TRIS) SUSP injection Inject into the muscle. 0.3 mL 0   COVID-19 mRNA vaccine  2023-2024 (COMIRNATY) syringe Inject into the muscle. 0.3 mL 0   ELIQUIS 5 MG TABS tablet Take 5 mg by mouth 2 (two) times daily.     influenza vaccine adjuvanted (FLUAD QUADRIVALENT) 0.5 ML injection Inject into the muscle. 0.5 mL 0   loratadine (CLARITIN) 10 MG tablet Take 10 mg by mouth daily as needed for allergies.      losartan (COZAAR) 25 MG tablet TAKE 1/2 TABLET BY MOUTH EVERY DAY 45 tablet 3   Multiple Vitamins-Minerals (MULTIVITAMIN WITH MINERALS) tablet Take 1 tablet by mouth daily.     omeprazole (PRILOSEC) 40 MG capsule Take 40 mg by mouth 2 (two) times daily.     oxybutynin (DITROPAN-XL) 10 MG 24 hr tablet Take 10 mg by mouth daily.     oxyCODONE (OXY IR/ROXICODONE) 5 MG immediate release tablet Take 1-2 tablets (5-10 mg total) by mouth every 6 (six) hours as needed for moderate pain, severe pain or breakthrough pain. 30 tablet 0   silodosin (RAPAFLO) 8 MG CAPS capsule Take 8 mg by mouth daily.     No facility-administered medications prior to visit.    PAST MEDICAL HISTORY: Past Medical History:  Diagnosis Date   Clotting disorder (HCC)    Dysrhythmia    PVC's   Factor V Leiden (HCC)    positive   Hemorrhoids    Hernia    History of blood clots    Pulmonary embolism   Hypertension    Jock itch 12-20-11   mild case at present   Poor circulation  Pulmonary embolism (HCC) 1997   Ulcer    leg   Wears glasses     PAST SURGICAL HISTORY: Past Surgical History:  Procedure Laterality Date   HAND SURGERY     Left   HEMORRHOID SURGERY N/A 09/02/2019   Procedure: HEMORRHOIDECTOMY, HEMORRHOIDAL LIGATION/PEXY;  Surgeon: Karie Soda, MD;  Location: [facility];  Service: General;  Laterality: N/A;   INGUINAL HERNIA REPAIR  12/23/2011   Procedure: HERNIA REPAIR INGUINAL ADULT;  Surgeon: Adolph Pollack, MD;  Location: [facility];  Service: General;  Laterality: Left;  Left Inguinal Hernia with Mesh   RECTAL EXAM UNDER ANESTHESIA N/A 09/02/2019   Procedure: ANORECTAL EXAM UNDER  ANESTHESIA;  Surgeon: Karie Soda, MD;  Location: [facility];  Service: General;  Laterality: N/A;   teeth extraction  01-01-2011  3 teeth extracted by oral surgeon   vein ablation surgery  8/12 and 12/08   vericose vein surgery  1997   R leg    FAMILY HISTORY: Family History  Problem Relation Age of Onset   Colon cancer Sister    Cancer Maternal Grandfather        colon    SOCIAL HISTORY: Social History   Socioeconomic History   Marital status: Married    Spouse name: Not on file   Number of children: Y   Years of education: Not on file   Highest education level: Not on file  Occupational History   Occupation: Firefighter: [employer]-  Tobacco Use   Smoking status: Never   Smokeless tobacco: Never  Vaping Use   Vaping Use: Never used  Substance and Sexual Activity   Alcohol use: Yes    Alcohol/week: 2.0 standard drinks of alcohol    Types: 2 Cans of beer per week    Comment: 3 dks per week   Drug use: No   Sexual activity: Yes  Other Topics Concern   Not on file  Social History Narrative   Moved here from [place].   Social Determinants of Health   Financial Resource Strain: Not on file  Food Insecurity: Not on file  Transportation Needs: Not on file  Physical Activity: Not on file  Stress: Not on file  Social Connections: Not on file  Intimate Partner Violence: Not on file     PHYSICAL EXAM  GENERAL EXAM/CONSTITUTIONAL: Vitals:  Vitals:   09/09/22 0848  BP: (!) 152/78  Pulse: 76  Weight: 208 lb 12.8 oz (94.7 kg)  Height: 6\' 3"  (1.905 m)   Body mass index is 26.1 kg/m. Wt Readings from Last 3 Encounters:  09/09/22 208 lb 12.8 oz (94.7 kg)  03/21/22 212 lb (96.2 kg)  12/28/21 206 lb (93.4 kg)   Patient is in no distress; well developed, nourished and groomed; neck is supple  CARDIOVASCULAR: Examination of carotid arteries is normal; no carotid bruits Regular rate and rhythm, no murmurs Examination of  peripheral vascular system by observation and palpation is normal  EYES: Ophthalmoscopic exam of optic discs and posterior segments is normal; no papilledema or hemorrhages No results found.  MUSCULOSKELETAL: Gait, strength, tone, movements noted in Neurologic exam below  NEUROLOGIC: MENTAL STATUS:      No data to display         awake, alert, oriented to person, place and time recent and remote memory intact normal attention and concentration language fluent, comprehension intact, naming intact fund of knowledge appropriate  CRANIAL NERVE:  2nd - no papilledema on fundoscopic  exam 2nd, 3rd, 4th, 6th - pupils equal and reactive to light, visual fields full to confrontation, extraocular muscles intact, no nystagmus 5th - facial sensation symmetric 7th - facial strength symmetric 8th - hearing intact 9th - palate elevates symmetrically, uvula midline 11th - shoulder shrug symmetric 12th - tongue protrusion midline  MOTOR:  normal bulk and tone, full strength in the BUE, BLE  SENSORY:  normal and symmetric to light touch, pinprick, temperature, vibration; EXCEPT SLIGHTLY REDUCE IN FEET / ANKLES; PP CAUSES TINGLING SENSATION  COORDINATION:  finger-nose-finger, fine finger movements normal  REFLEXES:  deep tendon reflexes --> BUE 1+; 3 AT KNEES; POSITIVE SUPRAPATELLAR; 1+ AT ANKLES; DOWN GOING TOES; NEG HOFFMANS  GAIT/STATION:  narrow based gait     DIAGNOSTIC DATA (LABS, IMAGING, TESTING) - I reviewed patient records, labs, notes, testing and imaging myself where available.  Lab Results  Component Value Date   WBC 9.5 08/25/2019   HGB 16.3 08/25/2019   HCT 48.5 08/25/2019   MCV 100.6 (H) 08/25/2019   PLT 240 08/25/2019      Component Value Date/Time   NA 138 08/25/2019 1128   K 4.4 08/25/2019 1128   CL 100 08/25/2019 1128   CO2 30 08/25/2019 1128   GLUCOSE 112 (H) 08/25/2019 1128   BUN 11 08/25/2019 1128   CREATININE 0.89 08/25/2019 1128   CALCIUM  9.8 08/25/2019 1128   PROT 6.9 12/20/2011 1025   ALBUMIN 4.2 12/20/2011 1025   AST 25 12/20/2011 1025   ALT 25 12/20/2011 1025   ALKPHOS 47 12/20/2011 1025   BILITOT 0.5 12/20/2011 1025   GFRNONAA >60 08/25/2019 1128   GFRAA >60 08/25/2019 1128   No results found for: "CHOL", "HDL", "LDLCALC", "LDLDIRECT", "TRIG", "CHOLHDL" No results found for: "HGBA1C" No results found for: "VITAMINB12" No results found for: "TSH"  11/05/17 CT abd/pelvis [I reviewed images myself and agree with interpretation. L4 vertebral body schmorl's node, inferior end plate, -VRP]  - No significant abnormality seen in the abdomen or pelvis.     ASSESSMENT AND PLAN  67 y.o. year old male here with:  Dx:  1. Lower extremity numbness   2. Fecal urgency   3. Nocturia   4. Hyperreflexia   5. Numbness and tingling of foot     PLAN:  BILATERAL FOOT NUMBNESS (intermittent; since ~Feb 2024) + urinary retention + fecal urgency + hyperreflexia - MRI lumbar / thoracic spine (rule out thoracic myelopathy, conus medullaris or cauda equina syndrome) - check neuropathy labs - consider EMG/NCS  Orders Placed This Encounter  Procedures   MR THORACIC SPINE W WO CONTRAST   MR Lumbar Spine W Wo Contrast   Vitamin B12   Hemoglobin A1c   ANA,IFA RA Diag Pnl w/rflx Tit/Patn   Multiple Myeloma Panel (SPEP&IFE w/QIG)   Return for pending if symptoms worsen or fail to improve, pending test results.    Suanne Marker, MD 09/09/2022, 9:50 AM Certified in Neurology, Neurophysiology and Neuroimaging  [facility] [address] [place], [place] [address] [phone]

## 2022-09-09 NOTE — Patient Instructions (Signed)
-   check labs and MRI thoracic / lumbar spine

## 2022-09-10 LAB — MULTIPLE MYELOMA PANEL, SERUM

## 2022-09-11 LAB — MULTIPLE MYELOMA PANEL, SERUM

## 2022-09-13 LAB — MULTIPLE MYELOMA PANEL, SERUM

## 2022-09-14 LAB — ANA,IFA RA DIAG PNL W/RFLX TIT/PATN
ANA Titer 1: NEGATIVE
Cyclic Citrullin Peptide Ab: 6 units (ref 0–19)
Rheumatoid fact SerPl-aCnc: <10 IU/mL (ref <14.0)

## 2022-09-14 LAB — MULTIPLE MYELOMA PANEL, SERUM
Alpha 1: 0.2 g/dL (ref 0.0–0.4)
Globulin, Total: 2.6 g/dL (ref 2.2–3.9)
IgA/Immunoglobulin A, Serum: 331 mg/dL (ref 61–437)

## 2022-09-14 LAB — HEMOGLOBIN A1C
Est. average glucose Bld gHb Est-mCnc: 163 mg/dL
Hgb A1c MFr Bld: 7.3 % — ABNORMAL HIGH (ref 4.8–5.6)

## 2022-09-14 LAB — VITAMIN B12: Vitamin B-12: 813 pg/mL (ref 232–1245)

## 2022-09-16 ENCOUNTER — Telehealth: Payer: Self-pay

## 2022-09-16 ENCOUNTER — Ambulatory Visit: Payer: Medicare PPO | Admitting: Podiatry

## 2022-09-16 NOTE — Telephone Encounter (Signed)
-----   Message from Suanne Marker, MD sent at 09/13/2022 10:05 AM EDT ----- A1c elevated in the diabetes range. Needs PCP follow up. Could be related to numbness sensation. Other labs ok.

## 2022-09-16 NOTE — Telephone Encounter (Signed)
I spoke with the patient and provided him with the results of the test. He will follow up with his PCP to discuss treatment options. He verbalized understanding and expressed appreciation for the call. All questions answered.

## 2022-09-25 ENCOUNTER — Ambulatory Visit: Payer: Medicare PPO | Admitting: Podiatry

## 2022-09-25 ENCOUNTER — Encounter: Payer: Self-pay | Admitting: Podiatry

## 2022-09-25 VITALS — BP 148/81 | HR 84

## 2022-09-25 DIAGNOSIS — B070 Plantar wart: Secondary | ICD-10-CM | POA: Diagnosis not present

## 2022-09-25 NOTE — Progress Notes (Signed)
   Chief Complaint  Patient presents with   Plantar Warts    "They're about the same."    HPI: 67 year old male presenting today  for follow-up evaluation of symptomatic skin lesions to the bilateral feet.  He has a history of plantar verruca's bilateral.  He believes that this has returned.  He presents today for further treatment evaluation   Past Medical History:  Diagnosis Date   Clotting disorder (HCC)    Dysrhythmia    PVC's   Factor V Leiden (HCC)    positive   Hemorrhoids    Hernia    History of blood clots    Pulmonary embolism   Hypertension    Jock itch 12-20-11   mild case at present   Poor circulation    Pulmonary embolism (HCC) 1997   Ulcer    leg   Wears glasses      Physical Exam: General: The patient is alert and oriented x3 in no acute distress.  Dermatology: Skin is warm, dry and supple bilateral lower extremities. Negative for open lesions or macerations.  Hyperkeratotic verruca lesions noted to the plantar aspect of the bilateral feet.  Vascular: Palpable pedal pulses bilaterally. No edema or erythema noted. Capillary refill within normal limits.  Neurological: Grossly intact via light touch.  Musculoskeletal Exam: Range of motion within normal limits to all pedal and ankle joints bilateral. Muscle strength 5/5 in all groups bilateral.   Assessment: 1. Plantar warts bilateral feet -Patient evaluated -Excisional debridement of the hyperkeratotic verruca tissue was performed today using a 312 scalpel.  Pinpoint bleeding noted upon debridement consistent with verruca.  Decision made today to apply Cantharone.  Cantharone and a light dressing applied -Return to clinic 3 weeks for follow-up  2.  Peripheral neuropathy lateral aspect of the feet bilateral -Patient believes that the symptoms may be nerve related.  His urologist believes that pain in his feet may be causing the fecal urgency that he is currently being treated for.  He also has a history of  sciatica with lumbar pathology. -I do believe the patient's symptoms may be coming from his lumbar spine and sciatica.  His presentation is very symmetrical but can be more symptomatic on one side and vice versa - Unfortunately there is no improvement with the Lotrisone cream.  Discontinue.  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 Felecia Shelling, DPM    [address] [place], [place] [address]                Office [phone]  Fax [phone]

## 2022-09-26 DIAGNOSIS — R351 Nocturia: Secondary | ICD-10-CM | POA: Diagnosis not present

## 2022-09-26 DIAGNOSIS — N401 Enlarged prostate with lower urinary tract symptoms: Secondary | ICD-10-CM | POA: Diagnosis not present

## 2022-09-26 DIAGNOSIS — R350 Frequency of micturition: Secondary | ICD-10-CM | POA: Diagnosis not present

## 2022-10-14 ENCOUNTER — Telehealth: Payer: Self-pay | Admitting: Diagnostic Neuroimaging

## 2022-10-14 NOTE — Telephone Encounter (Signed)
[employer] MCR Berkley Harvey: [id number] exp. 10/14/22-12/13/22 for GI

## 2022-10-15 DIAGNOSIS — M6281 Muscle weakness (generalized): Secondary | ICD-10-CM | POA: Diagnosis not present

## 2022-10-15 DIAGNOSIS — M62838 Other muscle spasm: Secondary | ICD-10-CM | POA: Diagnosis not present

## 2022-10-15 DIAGNOSIS — R152 Fecal urgency: Secondary | ICD-10-CM | POA: Diagnosis not present

## 2022-10-15 DIAGNOSIS — R3914 Feeling of incomplete bladder emptying: Secondary | ICD-10-CM | POA: Diagnosis not present

## 2022-10-15 DIAGNOSIS — R3912 Poor urinary stream: Secondary | ICD-10-CM | POA: Diagnosis not present

## 2022-10-21 ENCOUNTER — Ambulatory Visit: Payer: Medicare PPO | Admitting: Podiatry

## 2022-10-21 DIAGNOSIS — B070 Plantar wart: Secondary | ICD-10-CM

## 2022-10-21 NOTE — Progress Notes (Signed)
   Chief Complaint  Patient presents with   Plantar Warts    Pt stated that everything is going okay     HPI: 67 year old male presenting today  for follow-up evaluation of symptomatic skin lesions to the bilateral feet.  He has a history of plantar verruca's bilateral.  Last visit Cantharone applied.  He says it was tender for about 12 days.  Since that time he has noticed skin peeling off of the lesions and significant relief of his pain associated to the Cantharone application   Past Medical History:  Diagnosis Date   Clotting disorder (HCC)    Dysrhythmia    PVC's   Factor V Leiden (HCC)    positive   Hemorrhoids    Hernia    History of blood clots    Pulmonary embolism   Hypertension    Jock itch 12-20-11   mild case at present   Poor circulation    Pulmonary embolism (HCC) 1997   Ulcer    leg   Wears glasses      Physical Exam: General: The patient is alert and oriented x3 in no acute distress.  Dermatology: Skin is warm, dry and supple bilateral lower extremities. Negative for open lesions or macerations.  The verruca lesions appear to be mostly resolved.  There is some hyperkeratotic loosely adhered tissue around the area  Vascular: Palpable pedal pulses bilaterally. No edema or erythema noted. Capillary refill within normal limits.  Neurological: Grossly intact via light touch.  Musculoskeletal Exam: Range of motion within normal limits to all pedal and ankle joints bilateral. Muscle strength 5/5 in all groups bilateral.   Assessment: 1. Plantar warts bilateral feet - Light debridement performed today using a tissue nipper. -Recommend OTC salicylic acid as needed -Return to clinic as needed  2.  Peripheral neuropathy lateral aspect of the feet bilateral -Patient believes that the symptoms may be nerve related.  His urologist believes that pain in his feet may be causing the fecal urgency that he is currently being treated for.  He also has a history of  sciatica with lumbar pathology. -I do believe the patient's symptoms may be coming from his lumbar spine and sciatica.  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 Felecia Shelling, DPM    [address] [place], [place] [address]                Office [phone]  Fax [phone]

## 2022-10-22 ENCOUNTER — Encounter: Payer: Self-pay | Admitting: Diagnostic Neuroimaging

## 2022-10-23 ENCOUNTER — Ambulatory Visit
Admission: RE | Admit: 2022-10-23 | Discharge: 2022-10-23 | Disposition: A | Discharge status: Home / Self Care | Payer: Medicare PPO | Source: Ambulatory Visit | Attending: Diagnostic Neuroimaging | Admitting: Diagnostic Neuroimaging

## 2022-10-23 ENCOUNTER — Other Ambulatory Visit: Payer: Medicare PPO

## 2022-10-23 DIAGNOSIS — R200 Anesthesia of skin: Secondary | ICD-10-CM

## 2022-10-23 DIAGNOSIS — R351 Nocturia: Secondary | ICD-10-CM

## 2022-10-23 DIAGNOSIS — R292 Abnormal reflex: Secondary | ICD-10-CM | POA: Diagnosis not present

## 2022-10-23 DIAGNOSIS — R202 Paresthesia of skin: Secondary | ICD-10-CM | POA: Diagnosis not present

## 2022-10-23 DIAGNOSIS — R152 Fecal urgency: Secondary | ICD-10-CM

## 2022-10-23 MED ORDER — GADOPICLENOL 0.5 MMOL/ML IV SOLN
10.0000 mL | Freq: Once | INTRAVENOUS | Status: AC | PRN
  Administered 2022-10-23: 10 mL via INTRAVENOUS

## 2022-10-24 ENCOUNTER — Telehealth: Payer: Self-pay

## 2022-10-24 DIAGNOSIS — R200 Anesthesia of skin: Secondary | ICD-10-CM

## 2022-10-24 NOTE — Telephone Encounter (Signed)
Called patient and informed him per Dr. Dierdre Highman "Mild degenerative changes in the lumbar spine. Recommend conservative mgmt. Unremarkable MRI thoracic spine results. Please call patient. Continue current plan. -VRP ". Pt verbalized understanding. Pt had no questions at this time but was encouraged to call back if questions arise.

## 2022-10-24 NOTE — Telephone Encounter (Signed)
-----   Message from Glenford Bayley [facility] sent at 10/23/2022  6:57 PM EDT ----- Mild degenerative changes in the lumbar spine. Recommend conservative mgmt. -VRP

## 2022-10-29 NOTE — Telephone Encounter (Signed)
Pt request that I send the MRI report to PCP as well as urologist. The urologist is concerned that there could be nerve compression at the groin due to some of the symptoms he has had. I asked if the urologist would like to send [place] a copy of notes from visit and he states he has an apt coming up where he will review the MRI's and discuss with the pt.

## 2022-10-29 NOTE — Telephone Encounter (Signed)
Orders Placed This Encounter  Procedures   NCV with EMG(electromyography)   Suanne Marker, MD 10/29/2022, 4:53 PM Certified in Neurology, Neurophysiology and Neuroimaging  [facility] [address] [place], [place] [address] [phone]

## 2022-10-29 NOTE — Telephone Encounter (Signed)
Called the patient and advised the Dr Marjory Lies did place the order for the NCV/EMG. Pt was appreciative for the call back and is aware someone will reach out to him

## 2022-10-29 NOTE — Telephone Encounter (Signed)
Patient called in and stated he received a message on his voicemail from Sharon going over MRI results, but the message did not make sense and he would like to go over them with someone and make sure he is on the same page. Please give patient a call back whenever possible, thank you!

## 2022-10-29 NOTE — Addendum Note (Signed)
Addended by: Joycelyn Schmid R on: 10/29/2022 04:54 PM   Modules accepted: Orders

## 2022-10-29 NOTE — Telephone Encounter (Signed)
Called the patient and was able to review everything in detail with the pt in regard to his MRI results. Pt states it was told to him to continue current plan and he wanted clarification on what that means. In ov notes it was discussed that he would consider NCV/EMG. He would like to know if Dr Marjory Lies wants to move forward with that. Advised the pt he didn't mention it in the result note and given his A1C was elevated that could be causing the numbness concerns mentioned. Pt has not yet saw his PCP and addressed this concern. He understands treating his A1C could potentially help the symptoms he is having.  I will clarify with Dr Marjory Lies on whether he would like to stil move forward with MCV/EMG or wait and see if treating A1c improves his symptoms.  The patient did make a comment that he followed with his urologist who had concerns that nerves in the lower groin area could be impacting or

## 2022-11-14 DIAGNOSIS — E1165 Type 2 diabetes mellitus with hyperglycemia: Secondary | ICD-10-CM | POA: Diagnosis not present

## 2022-11-14 DIAGNOSIS — M25552 Pain in left hip: Secondary | ICD-10-CM | POA: Diagnosis not present

## 2022-11-14 DIAGNOSIS — R350 Frequency of micturition: Secondary | ICD-10-CM | POA: Diagnosis not present

## 2022-11-14 DIAGNOSIS — G629 Polyneuropathy, unspecified: Secondary | ICD-10-CM | POA: Diagnosis not present

## 2022-11-14 DIAGNOSIS — L989 Disorder of the skin and subcutaneous tissue, unspecified: Secondary | ICD-10-CM | POA: Diagnosis not present

## 2022-11-14 DIAGNOSIS — R152 Fecal urgency: Secondary | ICD-10-CM | POA: Diagnosis not present

## 2022-11-14 DIAGNOSIS — M533 Sacrococcygeal disorders, not elsewhere classified: Secondary | ICD-10-CM | POA: Diagnosis not present

## 2022-11-14 DIAGNOSIS — E1142 Type 2 diabetes mellitus with diabetic polyneuropathy: Secondary | ICD-10-CM | POA: Diagnosis not present

## 2022-11-21 DIAGNOSIS — M5416 Radiculopathy, lumbar region: Secondary | ICD-10-CM | POA: Diagnosis not present

## 2022-11-21 DIAGNOSIS — M25552 Pain in left hip: Secondary | ICD-10-CM | POA: Diagnosis not present

## 2022-12-03 DIAGNOSIS — M7062 Trochanteric bursitis, left hip: Secondary | ICD-10-CM | POA: Diagnosis not present

## 2022-12-03 DIAGNOSIS — M6281 Muscle weakness (generalized): Secondary | ICD-10-CM | POA: Diagnosis not present

## 2022-12-10 DIAGNOSIS — M7062 Trochanteric bursitis, left hip: Secondary | ICD-10-CM | POA: Diagnosis not present

## 2022-12-10 DIAGNOSIS — M6281 Muscle weakness (generalized): Secondary | ICD-10-CM | POA: Diagnosis not present

## 2022-12-12 ENCOUNTER — Encounter: Payer: Self-pay | Admitting: Diagnostic Neuroimaging

## 2022-12-12 ENCOUNTER — Ambulatory Visit: Payer: Medicare PPO | Admitting: Diagnostic Neuroimaging

## 2022-12-12 DIAGNOSIS — R200 Anesthesia of skin: Secondary | ICD-10-CM

## 2022-12-12 DIAGNOSIS — R202 Paresthesia of skin: Secondary | ICD-10-CM | POA: Diagnosis not present

## 2022-12-12 DIAGNOSIS — Z0289 Encounter for other administrative examinations: Secondary | ICD-10-CM

## 2022-12-12 NOTE — Progress Notes (Signed)
  Patient continues to have mild numbness in the toes and feet.  No significant progression over the last 4 to 5 months.  No symptoms in hands.  EMG/NCS demonstrates severe axonal neuropathy with some demyelinating features.  Study findings appear to be out of proportion to degree of patient's symptoms.  This may be related to technical factors such as patient height and cool limb temperature.  Patient also has new diagnosis of diabetes, which can cause neuropathy.  Will monitor symptoms and if any significant progression, then may proceed with other workup to look for CIDP or other secondary causes of neuropathy.  Will proceed with further workup including MRI brain and cervical spine to rule out other causes of central nervous system pathologies, which could cause hyperreflexia and incontinence symptoms.  Previously MRI thoracic and lumbar spine have been unremarkable.  Orders Placed This Encounter  Procedures   MR BRAIN W WO CONTRAST   MR CERVICAL SPINE W WO CONTRAST   Suanne Marker, MD 12/12/2022, 5:18 PM Certified in Neurology, Neurophysiology and Neuroimaging  [facility] [address], [address] [place], [place] [address] [phone]

## 2022-12-12 NOTE — Procedures (Signed)
[facility]  NCS (NERVE CONDUCTION STUDY) WITH EMG (ELECTROMYOGRAPHY) REPORT   STUDY DATE: 12/12/22 PATIENT NAME: Todd Gardner DOB: Dec 19, 1955 MRN: [id number]  ORDERING CLINICIAN: Joycelyn Schmid, MD   TECHNOLOGIST: Jenelle Mages ELECTROMYOGRAPHER: Glenford Bayley. Kaisa Wofford, MD  CLINICAL INFORMATION: 67 year old male with numbness in feet.  FINDINGS: NERVE CONDUCTION STUDY:  Left peroneal motor response has prolonged distal and see compared to 8.0 ms) decreased amplitude and normal conduction with stimulation fibular head and slow conduction velocity with stimulation at the popliteal fossa (13 m/s).   Bilateral tibial and right peroneal motor responses have normal distal latency, normal amplitudes and borderline slowed conduction velocities.  Bilateral sural and superficial peroneal sensory responses could not be obtained.  Bilateral tibial F wave latencies are normal.  Bilateral H reflex responses are prolonged.   NEEDLE ELECTROMYOGRAPHY:  Needle examination of right lower extremity is normal.   IMPRESSION:   Abnormal study demonstrating: -Severe axonal sensorimotor polyneuropathy with demyelinating slowing noted at the left peroneal nerve popliteal fossa stimulation. -No active or chronic denervation changes noted on needle EMG.   INTERPRETING PHYSICIAN:  Suanne Marker, MD Certified in Neurology, Neurophysiology and Neuroimaging  [facility] [address] [place], [place] [address] [phone]  [facility]    Nerve / Sites Muscle Latency Ref. Amplitude Ref. Rel Amp Segments Distance Velocity Ref. Area    ms ms mV mV %  cm m/s m/s mVms  L Peroneal - EDB     Ankle EDB 8.0 <=6.5 0.3 >=2.0 100 Ankle - EDB 9   0.7     Fib head EDB 15.3  0.4  168 Fib head - Ankle 31 43 >=44 1.2     Pop fossa EDB 24.8  0.3  58.1 Pop fossa - Fib head 12 13 >=44          Pop fossa - Ankle      R Peroneal - EDB     Ankle EDB 5.9 <=6.5 2.6 >=2.0  100 Ankle - EDB 9   8.1     Fib head EDB 14.4  2.2  85.4 Fib head - Ankle 31 37 >=44 7.7     Pop fossa EDB 17.5  2.4  110 Pop fossa - Fib head 21 66 >=44 9.3         Pop fossa - Ankle      L Tibial - AH     Ankle AH 5.7 <=5.8 7.8 >=4.0 100 Ankle - AH 9   22.1     Pop fossa AH 16.9  5.5  69.8 Pop fossa - Ankle 42 37 >=41 16.7  R Tibial - AH     Ankle AH 5.4 <=5.8 5.3 >=4.0 100 Ankle - AH 9   15.7     Pop fossa AH 16.2  4.8  91 Pop fossa - Ankle 42 39 >=41 14.0             SNC    Nerve / Sites Rec. Site Peak Lat Ref.  Amp Ref. Segments Distance    ms ms V V  cm  L Sural - Ankle (Calf)     Calf Ankle NR <=4.4 NR >=6 Calf - Ankle 14  R Sural - Ankle (Calf)     Calf Ankle NR <=4.4 NR >=6 Calf - Ankle 14  L Superficial peroneal - Ankle     Lat leg Ankle NR <=4.4 NR >=6 Lat leg - Ankle 14  R Superficial  peroneal - Ankle     Lat leg Ankle NR <=4.4 NR >=6 Lat leg - Ankle 14             F  Wave    Nerve F Lat Ref.   ms ms  L Tibial - AH 54.8 <=56.0  R Tibial - AH 47.9 <=56.0         H Reflex    Nerve H Lat Lat Hmax   ms ms   Left Right Ref. Left Right Ref.  Tibial - Soleus 44.2 44.4 <=35.0 26.6 39.1 <=35.0         EMG Summary Table    Spontaneous MUAP Recruitment  Muscle IA Fib PSW Fasc Other Amp Dur. Poly Pattern  R. Vastus medialis Normal None None None _______ Normal Normal Normal Normal  R. Tibialis anterior Normal None None None _______ Normal Normal Normal Normal  R. Gastrocnemius (Medial head) Normal None None None _______ Normal Normal Normal Normal

## 2022-12-13 ENCOUNTER — Telehealth: Payer: Self-pay | Admitting: Diagnostic Neuroimaging

## 2022-12-13 NOTE — Telephone Encounter (Signed)
[employer] ZOXW:[id number] exp. 12/13/22-02/11/23 sent to GI [phone]

## 2022-12-18 DIAGNOSIS — M6281 Muscle weakness (generalized): Secondary | ICD-10-CM | POA: Diagnosis not present

## 2022-12-18 DIAGNOSIS — M7062 Trochanteric bursitis, left hip: Secondary | ICD-10-CM | POA: Diagnosis not present

## 2022-12-26 ENCOUNTER — Other Ambulatory Visit: Payer: Medicare PPO

## 2022-12-31 DIAGNOSIS — I10 Essential (primary) hypertension: Secondary | ICD-10-CM | POA: Diagnosis not present

## 2022-12-31 DIAGNOSIS — D6869 Other thrombophilia: Secondary | ICD-10-CM | POA: Diagnosis not present

## 2022-12-31 DIAGNOSIS — E1165 Type 2 diabetes mellitus with hyperglycemia: Secondary | ICD-10-CM | POA: Diagnosis not present

## 2022-12-31 DIAGNOSIS — Z86711 Personal history of pulmonary embolism: Secondary | ICD-10-CM | POA: Diagnosis not present

## 2023-01-01 DIAGNOSIS — M7062 Trochanteric bursitis, left hip: Secondary | ICD-10-CM | POA: Diagnosis not present

## 2023-01-01 DIAGNOSIS — M6281 Muscle weakness (generalized): Secondary | ICD-10-CM | POA: Diagnosis not present

## 2023-01-02 DIAGNOSIS — L814 Other melanin hyperpigmentation: Secondary | ICD-10-CM | POA: Diagnosis not present

## 2023-01-02 DIAGNOSIS — L950 Livedoid vasculitis: Secondary | ICD-10-CM | POA: Diagnosis not present

## 2023-01-02 DIAGNOSIS — D1801 Hemangioma of skin and subcutaneous tissue: Secondary | ICD-10-CM | POA: Diagnosis not present

## 2023-01-02 DIAGNOSIS — L905 Scar conditions and fibrosis of skin: Secondary | ICD-10-CM | POA: Diagnosis not present

## 2023-01-02 DIAGNOSIS — L570 Actinic keratosis: Secondary | ICD-10-CM | POA: Diagnosis not present

## 2023-01-08 DIAGNOSIS — M6281 Muscle weakness (generalized): Secondary | ICD-10-CM | POA: Diagnosis not present

## 2023-01-08 DIAGNOSIS — M7062 Trochanteric bursitis, left hip: Secondary | ICD-10-CM | POA: Diagnosis not present

## 2023-01-09 ENCOUNTER — Ambulatory Visit
Admission: RE | Admit: 2023-01-09 | Discharge: 2023-01-09 | Disposition: A | Discharge status: Home / Self Care | Payer: Medicare PPO | Source: Ambulatory Visit | Attending: Diagnostic Neuroimaging | Admitting: Diagnostic Neuroimaging

## 2023-01-09 DIAGNOSIS — R200 Anesthesia of skin: Secondary | ICD-10-CM

## 2023-01-09 DIAGNOSIS — R202 Paresthesia of skin: Secondary | ICD-10-CM | POA: Diagnosis not present

## 2023-01-09 MED ORDER — GADOPICLENOL 0.5 MMOL/ML IV SOLN
10.0000 mL | Freq: Once | INTRAVENOUS | Status: AC | PRN
  Administered 2023-01-09: 10 mL via INTRAVENOUS

## 2023-01-10 ENCOUNTER — Other Ambulatory Visit (HOSPITAL_BASED_OUTPATIENT_CLINIC_OR_DEPARTMENT_OTHER): Payer: Self-pay

## 2023-01-10 MED ORDER — RSVPREF3 VAC RECOMB ADJUVANTED 120 MCG/0.5ML IM SUSR
0.5000 mL | Freq: Once | INTRAMUSCULAR | 0 refills | Status: AC
Start: 2023-01-10 — End: 2023-01-11
  Filled 2023-01-10: qty 0.5, 1d supply, fill #0

## 2023-01-15 ENCOUNTER — Telehealth: Payer: Self-pay

## 2023-01-15 DIAGNOSIS — M6281 Muscle weakness (generalized): Secondary | ICD-10-CM | POA: Diagnosis not present

## 2023-01-15 DIAGNOSIS — M7062 Trochanteric bursitis, left hip: Secondary | ICD-10-CM | POA: Diagnosis not present

## 2023-01-15 NOTE — Telephone Encounter (Signed)
Called patient and LVM to give us a call back

## 2023-01-15 NOTE — Telephone Encounter (Signed)
-----   Message from Suanne Marker sent at 01/15/2023  1:27 PM EDT ----- Multilevel degenerative changes and pinched nerves (could affect hands), without spinal cord issues (therefore does not explain symptoms in feet).  Recommend conservative management. -VRP

## 2023-01-23 DIAGNOSIS — D6869 Other thrombophilia: Secondary | ICD-10-CM | POA: Diagnosis not present

## 2023-01-23 DIAGNOSIS — E1140 Type 2 diabetes mellitus with diabetic neuropathy, unspecified: Secondary | ICD-10-CM | POA: Diagnosis not present

## 2023-01-23 DIAGNOSIS — I10 Essential (primary) hypertension: Secondary | ICD-10-CM | POA: Diagnosis not present

## 2023-01-23 DIAGNOSIS — Z0000 Encounter for general adult medical examination without abnormal findings: Secondary | ICD-10-CM | POA: Diagnosis not present

## 2023-01-23 DIAGNOSIS — Z125 Encounter for screening for malignant neoplasm of prostate: Secondary | ICD-10-CM | POA: Diagnosis not present

## 2023-01-23 DIAGNOSIS — E7800 Pure hypercholesterolemia, unspecified: Secondary | ICD-10-CM | POA: Diagnosis not present

## 2023-01-23 DIAGNOSIS — Z1331 Encounter for screening for depression: Secondary | ICD-10-CM | POA: Diagnosis not present

## 2023-01-23 DIAGNOSIS — G629 Polyneuropathy, unspecified: Secondary | ICD-10-CM | POA: Diagnosis not present

## 2023-01-23 DIAGNOSIS — N3281 Overactive bladder: Secondary | ICD-10-CM | POA: Diagnosis not present

## 2023-01-23 DIAGNOSIS — E1169 Type 2 diabetes mellitus with other specified complication: Secondary | ICD-10-CM | POA: Diagnosis not present

## 2023-01-24 ENCOUNTER — Other Ambulatory Visit (HOSPITAL_BASED_OUTPATIENT_CLINIC_OR_DEPARTMENT_OTHER): Payer: Self-pay

## 2023-01-24 MED ORDER — COVID-19 MRNA VAC-TRIS(PFIZER) 30 MCG/0.3ML IM SUSY
0.3000 mL | PREFILLED_SYRINGE | Freq: Once | INTRAMUSCULAR | 0 refills | Status: AC
Start: 2023-01-24 — End: 2023-01-25
  Filled 2023-01-24: qty 0.3, 1d supply, fill #0

## 2023-01-28 ENCOUNTER — Other Ambulatory Visit: Payer: Self-pay | Admitting: Cardiology

## 2023-02-03 ENCOUNTER — Telehealth: Payer: Self-pay

## 2023-02-03 ENCOUNTER — Other Ambulatory Visit: Payer: Self-pay

## 2023-02-03 DIAGNOSIS — I83891 Varicose veins of right lower extremities with other complications: Secondary | ICD-10-CM

## 2023-02-03 NOTE — Telephone Encounter (Signed)
Pt called to schedule a repeat reflux study of RLE and to see MD. He was offered an appt next month but wants to schedule for after the new year. He wants a reflux study repeated. He states his symptoms are not worsening, but unimproved. He has been schedule-no further questions/concerns at this time.

## 2023-02-04 ENCOUNTER — Encounter: Payer: Self-pay | Admitting: Cardiology

## 2023-02-04 ENCOUNTER — Ambulatory Visit: Payer: Medicare PPO | Attending: Cardiology | Admitting: Cardiology

## 2023-02-04 VITALS — BP 130/70 | HR 97 | Ht 75.0 in | Wt 207.0 lb

## 2023-02-04 DIAGNOSIS — D6851 Activated protein C resistance: Secondary | ICD-10-CM | POA: Diagnosis not present

## 2023-02-04 DIAGNOSIS — I493 Ventricular premature depolarization: Secondary | ICD-10-CM | POA: Diagnosis not present

## 2023-02-04 DIAGNOSIS — I10 Essential (primary) hypertension: Secondary | ICD-10-CM | POA: Diagnosis not present

## 2023-02-04 NOTE — Patient Instructions (Signed)

## 2023-02-04 NOTE — Progress Notes (Signed)
Cardiology Office Note:  .   Date:  02/04/2023  ID:  Todd Gardner, DOB 1956-03-06, MRN [id number] PCP: Tally Joe, MD  [facility] Providers Cardiologist:  Donato Schultz, MD     History of Present Illness: .   Todd Gardner is a 67 y.o. male Discussed with the use of AI scribe  History of Present Illness   The 67 year old patient with a history of frequent, unifocal PVCs, factor five Leiden deficiency, and varicose veins of the right lower extremity, presented for a follow-up visit. The patient has been on Eliquis for anticoagulation, which was switched from warfarin due to associated hair loss. The patient also has a history of elevated blood pressure, which at times was associated with normal tension. He is currently on a low dose of Losartan (12.5 mg daily), as previous higher doses resulted in significantly low blood pressures.  The patient reported occasional episodes of dizziness, but these were not always captured due to circumstances. He also reported a significant nosebleed following a COVID infection, which he believes may have altered the internal blood arrangement in his nose. The patient's primary care physician suggested this could be due to thinning of the skin from Eliquis use.  The patient also reported changes in his medication regimen for his prostate, which has been associated with unpleasant side effects without significant improvement in his condition. He has also been diagnosed as prediabetic with a recent A1C of 7.3, and has made significant changes to his diet and exercise regimen in response.  The patient has not felt any PVCs for approximately six to seven months. He has been monitoring his blood pressure at home, with readings mostly under good control, although some higher readings have been noted in office settings. The patient plans to continue monitoring his blood pressure at home on a monthly basis.            Studies Reviewed: .         Results LABS LDL: 100 K: 4.5 Hb: 15.2 A1c: 7.3  DIAGNOSTIC Echocardiogram: Normal ejection fraction, no evidence of cardiomyopathy Risk Assessment/Calculations:            Physical Exam:   VS:  BP 130/70   Pulse 97   Ht 6\' 3"  (1.905 m)   Wt 207 lb (93.9 kg)   SpO2 97%   BMI 25.87 kg/m    Wt Readings from Last 3 Encounters:  02/04/23 207 lb (93.9 kg)  09/09/22 208 lb 12.8 oz (94.7 kg)  03/21/22 212 lb (96.2 kg)    GEN: Well nourished, well developed in no acute distress NECK: No JVD; No carotid bruits CARDIAC: RRR, no murmurs, no rubs, no gallops RESPIRATORY:  Clear to auscultation without rales, wheezing or rhonchi  ABDOMEN: Soft, non-tender, non-distended EXTREMITIES:  No edema; No deformity   ASSESSMENT AND PLAN: .    Assessment and Plan    Premature Ventricular Contractions (PVCs) PVCs have been less frequent, with no episodes in the past six to seven months. Previous echocardiogram showed normal ejection fraction and no cardiomyopathy. - Continue current management - Follow up in one year  Hypertension Blood pressure is well-controlled on losartan 12.5 mg daily. Higher doses previously caused hypotension. Occasional dizziness episodes, but blood pressure was normal during these times. Discussed risks of hypotension with higher doses and benefits of current regimen. - Continue losartan 12.5 mg daily - Check blood pressure at home monthly  Factor V Leiden Deficiency On Eliquis anticoagulation and doing well. Previously  experienced hair loss with warfarin. Discussed risks of anticoagulation including bleeding, and benefits of preventing thromboembolic events. - Continue Eliquis anticoagulation  Epistaxis Recent significant epistaxis from the right nostril, possibly related to post-COVID changes and anticoagulation with Eliquis. Discussed monitoring for further episodes and potential need for ENT evaluation if recurrent. - Monitor for further episodes of  epistaxis  Varicose Veins Varicose veins in the right lower extremity, greater than the left. Previous radiofrequency ablation and use of Ted hose. Discussed benefits of continued use of compression stockings. - Continue wearing Ted hose  Prediabetes Diagnosed with prediabetes, recent A1c of 7.3. Making dietary changes and exercising with a trainer. Discussed benefits of lifestyle modifications in preventing progression to diabetes and improving overall health. - Continue dietary modifications - Continue exercise regimen  Benign Prostatic Hyperplasia (BPH) Ongoing issues with prostate medications causing side effects without significant improvement. Multiple medications tried with adverse effects, including gynecomastia. Discussed need to review medication efficacy and side effects with urologist. - Discuss medication efficacy and side effects with urologist  General Health Maintenance Following a Mediterranean and plant-based diet as part of lifestyle modifications. Discussed benefits of these diets in managing prediabetes and overall cardiovascular health. - Continue Mediterranean diet - Continue plant-based diet  Follow-up - Follow up in one year - Reach out if needed.               Signed, Donato Schultz, MD

## 2023-02-06 ENCOUNTER — Other Ambulatory Visit (HOSPITAL_BASED_OUTPATIENT_CLINIC_OR_DEPARTMENT_OTHER): Payer: Self-pay

## 2023-02-06 MED ORDER — FLUAD 0.5 ML IM SUSY
0.5000 mL | PREFILLED_SYRINGE | Freq: Once | INTRAMUSCULAR | 0 refills | Status: AC
  Filled 2023-02-06: qty 0.5, 1d supply, fill #0

## 2023-02-12 DIAGNOSIS — Z800 Family history of malignant neoplasm of digestive organs: Secondary | ICD-10-CM | POA: Diagnosis not present

## 2023-02-12 DIAGNOSIS — D6851 Activated protein C resistance: Secondary | ICD-10-CM | POA: Diagnosis not present

## 2023-02-12 DIAGNOSIS — Z860101 Personal history of adenomatous and serrated colon polyps: Secondary | ICD-10-CM | POA: Diagnosis not present

## 2023-02-12 DIAGNOSIS — Z7901 Long term (current) use of anticoagulants: Secondary | ICD-10-CM | POA: Diagnosis not present

## 2023-02-12 DIAGNOSIS — Z86711 Personal history of pulmonary embolism: Secondary | ICD-10-CM | POA: Diagnosis not present

## 2023-02-18 ENCOUNTER — Telehealth: Payer: Self-pay | Admitting: *Deleted

## 2023-02-18 NOTE — Telephone Encounter (Signed)
   Primary Cardiologist: Donato Schultz, MD  Chart reviewed as part of pre-operative protocol coverage. Given past medical history and time since last visit, based on ACC/AHA guidelines, Todd Gardner would be at acceptable risk for the planned procedure without further cardiovascular testing.   Patient was advised that if he develops new symptoms prior to surgery to contact our office to arrange a follow-up appointment. He verbalized understanding.  Eliquis is not managed by cardiology. Patient has history of Factor V Leiden deficiency.  I will route this recommendation to the requesting party via Epic fax function and remove from pre-op pool.  Please call with questions.  Levi Aland, NP-C  02/18/2023, 1:09 PM [address] Office [phone] Fax [phone]

## 2023-02-18 NOTE — Telephone Encounter (Signed)
   Pre-operative Risk Assessment    Patient Name: Todd Gardner  DOB: 05/04/1955 MRN: [id number]  DATE OF LAST VISIT: 02/04/23 DR. SKAINS DATE OF NEXT VISIT: NONE    Request for Surgical Clearance    Procedure:   COLONOSCOPY  Date of Surgery:  Clearance 03/27/23                                 Surgeon:  DR. Nicholes Mango Surgeon's Group or Practice Name:  [facility] Phone number:  [phone] Fax number:  [phone]   Type of Clearance Requested:   - Medical  - Pharmacy:  Hold Apixaban (Eliquis)     Type of Anesthesia:   PROPOFOL   Additional requests/questions:    [name]   02/18/2023, 12:03 PM

## 2023-02-28 DIAGNOSIS — I10 Essential (primary) hypertension: Secondary | ICD-10-CM | POA: Diagnosis not present

## 2023-02-28 DIAGNOSIS — E1169 Type 2 diabetes mellitus with other specified complication: Secondary | ICD-10-CM | POA: Diagnosis not present

## 2023-03-05 ENCOUNTER — Encounter: Payer: Self-pay | Admitting: Diagnostic Neuroimaging

## 2023-03-05 ENCOUNTER — Ambulatory Visit: Payer: Medicare PPO | Admitting: Diagnostic Neuroimaging

## 2023-03-05 VITALS — BP 133/66 | HR 69 | Ht 75.0 in | Wt 207.4 lb

## 2023-03-05 DIAGNOSIS — R202 Paresthesia of skin: Secondary | ICD-10-CM | POA: Diagnosis not present

## 2023-03-05 DIAGNOSIS — R200 Anesthesia of skin: Secondary | ICD-10-CM | POA: Diagnosis not present

## 2023-03-05 DIAGNOSIS — E1142 Type 2 diabetes mellitus with diabetic polyneuropathy: Secondary | ICD-10-CM | POA: Diagnosis not present

## 2023-03-05 NOTE — Progress Notes (Signed)
[facility]  PATIENT: Todd Gardner DOB: [birth year]  REFERRING CLINICIAN: Tally Joe, MD HISTORY FROM: PATIENT REASON FOR VISIT: NEW CONSULT   HISTORICAL  CHIEF COMPLAINT:  Chief Complaint  Patient presents with   Follow-up    Patient in room #7 and alone. Patient states he here today to discuss next after having his MRi results.    HISTORY OF PRESENT ILLNESS:   UPDATE (03/05/23, VRP): Since last visit, doing about the same. Symptoms are stable in severity / pain.  Quality of symptoms have slightly changed with several discrete pinpoint sensations like a "dot matrix printer" still mainly involving the feet.  No issues or proximally.  No issues with upper extremities.  Has been adjusting his diet to improve his A1c elevation.  PRIOR HPI (09/09/22): 67 year old male here for evaluation of numbness and tingling in toes.  Symptoms started about 4 months ago with numbness and tingling in the lateral toes bilaterally.  Symptoms worse at night and after standing for a while.  Symptoms are intermittent.  He initially went to dermatology for athlete's foot evaluation but this was ruled out.  Since then he has been to podiatrist for evaluation of plantar warts and treatment of that.  Patient also has some issues with enlarged prostate, nocturia and fecal urgency since 2022.  He has been to PCP and urology for this.  History of PE with factor V Leiden deficiency.  On anticoagulation.  Has some low back pain near the sacral region.  Has had some sciatica on the left side.  Has some issues with varicose veins and associated ulcers, evaluated by vascular surgery.  No issues with neck pain or upper extremities.   REVIEW OF SYSTEMS: Full 14 system review of systems performed and negative with exception of: as per HPI.  ALLERGIES: Allergies  Allergen Reactions   Lidocaine Other (See Comments)    Topical Burning sensation     HOME MEDICATIONS: Outpatient  Medications Prior to Visit  Medication Sig Dispense Refill   clotrimazole-betamethasone (LOTRISONE) cream Apply 1 Application topically daily. 30 g 0   COVID-19 mRNA bivalent vaccine, Pfizer, (PFIZER COVID-19 VAC BIVALENT) injection Inject into the muscle. 0.3 mL 0   COVID-19 mRNA Vac-TriS, Pfizer, (PFIZER-BIONT COVID-19 VAC-TRIS) SUSP injection Inject into the muscle. 0.3 mL 0   COVID-19 mRNA vaccine 2023-2024 (COMIRNATY) syringe Inject into the muscle. 0.3 mL 0   ELIQUIS 5 MG TABS tablet Take 5 mg by mouth 2 (two) times daily.     influenza vaccine adjuvanted (FLUAD QUADRIVALENT) 0.5 ML injection Inject into the muscle. 0.5 mL 0   loratadine (CLARITIN) 10 MG tablet Take 10 mg by mouth daily as needed for allergies.      losartan (COZAAR) 25 MG tablet TAKE 1/2 TABLET BY MOUTH DAILY 45 tablet 0   Multiple Vitamins-Minerals (MULTIVITAMIN WITH MINERALS) tablet Take 1 tablet by mouth daily.     omeprazole (PRILOSEC) 40 MG capsule Take 40 mg by mouth 2 (two) times daily.     oxybutynin (DITROPAN-XL) 10 MG 24 hr tablet Take 10 mg by mouth daily.     oxyCODONE (OXY IR/ROXICODONE) 5 MG immediate release tablet Take 1-2 tablets (5-10 mg total) by mouth every 6 (six) hours as needed for moderate pain, severe pain or breakthrough pain. 30 tablet 0   silodosin (RAPAFLO) 8 MG CAPS capsule Take 8 mg by mouth daily.     No facility-administered medications prior to visit.    PAST MEDICAL HISTORY: Past Medical History:  Diagnosis Date   Clotting disorder (HCC)    Dysrhythmia    PVC's   Factor V Leiden (HCC)    positive   Hemorrhoids    Hernia    History of blood clots    Pulmonary embolism   Hypertension    Jock itch 12-20-11   mild case at present   Poor circulation    Pulmonary embolism (HCC) 1997   Ulcer    leg   Wears glasses     PAST SURGICAL HISTORY: Past Surgical History:  Procedure Laterality Date   HAND SURGERY     Left   HEMORRHOID SURGERY N/A 09/02/2019   Procedure:  HEMORRHOIDECTOMY, HEMORRHOIDAL LIGATION/PEXY;  Surgeon: Karie Soda, MD;  Location: [facility];  Service: General;  Laterality: N/A;   INGUINAL HERNIA REPAIR  12/23/2011   Procedure: HERNIA REPAIR INGUINAL ADULT;  Surgeon: Adolph Pollack, MD;  Location: [facility];  Service: General;  Laterality: Left;  Left Inguinal Hernia with Mesh   RECTAL EXAM UNDER ANESTHESIA N/A 09/02/2019   Procedure: ANORECTAL EXAM UNDER ANESTHESIA;  Surgeon: Karie Soda, MD;  Location: [facility];  Service: General;  Laterality: N/A;   teeth extraction  01-01-2011  3 teeth extracted by oral surgeon   vein ablation surgery  8/12 and 12/08   vericose vein surgery  1997   R leg    FAMILY HISTORY: Family History  Problem Relation Age of Onset   Colon cancer Sister    Cancer Maternal Grandfather        colon    SOCIAL HISTORY: Social History   Socioeconomic History   Marital status: Married    Spouse name: Not on file   Number of children: Y   Years of education: Not on file   Highest education level: Not on file  Occupational History   Occupation: Firefighter: [employer]  Tobacco Use   Smoking status: Never   Smokeless tobacco: Never  Vaping Use   Vaping status: Never Used  Substance and Sexual Activity   Alcohol use: Yes    Alcohol/week: 2.0 standard drinks of alcohol    Types: 2 Cans of beer per week    Comment: gin and tonic daily   Drug use: No   Sexual activity: Yes  Other Topics Concern   Not on file  Social History Narrative   Moved here from [place].   Social Drivers of Corporate investment banker Strain: Not on file  Food Insecurity: Not on file  Transportation Needs: Not on file  Physical Activity: Not on file  Stress: Not on file  Social Connections: Not on file  Intimate Partner Violence: Not on file     PHYSICAL EXAM  GENERAL EXAM/CONSTITUTIONAL: Vitals:  Vitals:   03/05/23 0955  BP: 133/66  Pulse: 69  Weight: 207 lb 6.4 oz (94.1 kg)   Height: 6\' 3"  (1.905 m)   Body mass index is 25.92 kg/m. Wt Readings from Last 3 Encounters:  03/05/23 207 lb 6.4 oz (94.1 kg)  02/04/23 207 lb (93.9 kg)  09/09/22 208 lb 12.8 oz (94.7 kg)   Patient is in no distress; well developed, nourished and groomed; neck is supple  CARDIOVASCULAR: Examination of carotid arteries is normal; no carotid bruits Regular rate and rhythm, no murmurs Examination of peripheral vascular system by observation and palpation is normal  EYES: Ophthalmoscopic exam of optic discs and posterior segments is normal; no papilledema or hemorrhages No results found.  MUSCULOSKELETAL:  Gait, strength, tone, movements noted in Neurologic exam below  NEUROLOGIC: MENTAL STATUS:      No data to display         awake, alert, oriented to person, place and time recent and remote memory intact normal attention and concentration language fluent, comprehension intact, naming intact fund of knowledge appropriate  CRANIAL NERVE:  2nd - no papilledema on fundoscopic exam 2nd, 3rd, 4th, 6th - pupils equal and reactive to light, visual fields full to confrontation, extraocular muscles intact, no nystagmus 5th - facial sensation symmetric 7th - facial strength symmetric 8th - hearing intact 9th - palate elevates symmetrically, uvula midline 11th - shoulder shrug symmetric 12th - tongue protrusion midline  MOTOR:  normal bulk and tone, full strength in the BUE, BLE  SENSORY:  normal and symmetric to light touch, pinprick, temperature, vibration; EXCEPT SLIGHTLY REDUCE IN FEET / ANKLES; PP CAUSES TINGLING SENSATION  COORDINATION:  finger-nose-finger, fine finger movements normal  REFLEXES:  deep tendon reflexes --> BUE 1+; 2 AT KNEES; POSITIVE SUPRAPATELLAR; 1+ AT ANKLES; DOWN GOING TOES; NEG HOFFMANS  GAIT/STATION:  narrow based gait     DIAGNOSTIC DATA (LABS, IMAGING, TESTING) - I reviewed patient records, labs, notes, testing and imaging myself  where available.  Lab Results  Component Value Date   WBC 9.5 08/25/2019   HGB 16.3 08/25/2019   HCT 48.5 08/25/2019   MCV 100.6 (H) 08/25/2019   PLT 240 08/25/2019      Component Value Date/Time   NA 138 08/25/2019 1128   K 4.4 08/25/2019 1128   CL 100 08/25/2019 1128   CO2 30 08/25/2019 1128   GLUCOSE 112 (H) 08/25/2019 1128   BUN 11 08/25/2019 1128   CREATININE 0.89 08/25/2019 1128   CALCIUM 9.8 08/25/2019 1128   PROT 7.1 09/09/2022 1009   ALBUMIN 4.2 12/20/2011 1025   AST 25 12/20/2011 1025   ALT 25 12/20/2011 1025   ALKPHOS 47 12/20/2011 1025   BILITOT 0.5 12/20/2011 1025   GFRNONAA >60 08/25/2019 1128   GFRAA >60 08/25/2019 1128   No results found for: "CHOL", "HDL", "LDLCALC", "LDLDIRECT", "TRIG", "CHOLHDL" Lab Results  Component Value Date   HGBA1C 7.3 (H) 09/09/2022   Lab Results  Component Value Date   VITAMINB12 813 09/09/2022   No results found for: "TSH"  11/05/17 CT abd/pelvis [I reviewed images myself and agree with interpretation. L4 vertebral body schmorl's node, inferior end plate, -VRP]  - No significant abnormality seen in the abdomen or pelvis.   12/12/22 EMG/NCS -Severe axonal sensorimotor polyneuropathy with demyelinating slowing noted at the left peroneal nerve popliteal fossa stimulation. -No active or chronic denervation changes noted on needle EMG  01/09/23 MRI brain - Unremarkable MRI brain with and without contrast.  Incidental mild chronic sinusitis changes.  No acute findings.  01/09/23 MRI cervical spine (with and without) demonstrating: - At C3-4 uncovertebral joint and facet hypertrophy with severe bilateral foraminal stenosis. - At C4-5 disc bulging and facet hypertrophy with moderate bilateral foraminal stenosis. - At C5-6 uncovertebral joint and facet hypertrophy with severe left foraminal stenosis.  10/23/22 Unremarkable MRI scan of thoracic spine with and without contrast.   10/23/22 MRI scan lumbar spine with and without  contrast showing mild disc and facet degenerative changes throughout most prominent at L4-5 where there is mild right-sided foraminal narrowing but no definite compression.     ASSESSMENT AND PLAN  67 y.o. year old male here with:  Dx:  1. Numbness and tingling of both  feet   2. Type 2 diabetes mellitus with diabetic polyneuropathy, without long-term current use of insulin (HCC)      PLAN:  BILATERAL FOOT NUMBNESS (intermittent; since ~Feb 2024; possibly related to new dx of diabetes)   -  Patient continues to have mild numbness in the toes and feet.  No significant progression since Feb 2024.  No symptoms in hands.  -  EMG/NCS demonstrates severe axonal neuropathy with some demyelinating features.  Study findings appear to be out of proportion to degree of patient's symptoms.  This may be related to technical factors such as patient height and cool limb temperature.  Patient also has new diagnosis of diabetes, which can cause neuropathy.  Will monitor symptoms and if any significant progression, then may proceed with other workup to look for CIDP or other secondary causes of neuropathy.  - may try capsaicin cream, lidocaine patch / cream, alpha-lipoic acid 600mg  daily   + urinary retention (MRI brain and spinal cord imaging unremarkable) - follow up with urology   + fecal urgency (MRI brain and spinal cord imaging unremarkable) - follow up with GI   Return for pending if symptoms worsen or fail to improve.    Suanne Marker, MD 03/05/2023, 2:32 PM Certified in Neurology, Neurophysiology and Neuroimaging  [facility] [address] [place], [place] [address] [phone]

## 2023-03-14 ENCOUNTER — Ambulatory Visit: Payer: Medicare PPO | Admitting: Surgery

## 2023-03-20 DIAGNOSIS — N3281 Overactive bladder: Secondary | ICD-10-CM | POA: Diagnosis not present

## 2023-03-20 DIAGNOSIS — N401 Enlarged prostate with lower urinary tract symptoms: Secondary | ICD-10-CM | POA: Diagnosis not present

## 2023-03-20 DIAGNOSIS — N400 Enlarged prostate without lower urinary tract symptoms: Secondary | ICD-10-CM | POA: Diagnosis not present

## 2023-03-27 DIAGNOSIS — Z09 Encounter for follow-up examination after completed treatment for conditions other than malignant neoplasm: Secondary | ICD-10-CM | POA: Diagnosis not present

## 2023-03-27 DIAGNOSIS — Z800 Family history of malignant neoplasm of digestive organs: Secondary | ICD-10-CM | POA: Diagnosis not present

## 2023-03-27 DIAGNOSIS — Z860101 Personal history of adenomatous and serrated colon polyps: Secondary | ICD-10-CM | POA: Diagnosis not present

## 2023-04-28 ENCOUNTER — Ambulatory Visit: Payer: Medicare PPO | Admitting: Surgery

## 2023-04-28 ENCOUNTER — Ambulatory Visit (HOSPITAL_COMMUNITY): Payer: Medicare PPO

## 2023-04-29 ENCOUNTER — Other Ambulatory Visit: Payer: Self-pay | Admitting: Cardiology

## 2023-05-01 DIAGNOSIS — N138 Other obstructive and reflux uropathy: Secondary | ICD-10-CM | POA: Diagnosis not present

## 2023-05-01 DIAGNOSIS — N401 Enlarged prostate with lower urinary tract symptoms: Secondary | ICD-10-CM | POA: Diagnosis not present

## 2023-05-01 DIAGNOSIS — N3289 Other specified disorders of bladder: Secondary | ICD-10-CM | POA: Diagnosis not present

## 2023-05-14 DIAGNOSIS — E1169 Type 2 diabetes mellitus with other specified complication: Secondary | ICD-10-CM | POA: Diagnosis not present

## 2023-05-30 DIAGNOSIS — E1169 Type 2 diabetes mellitus with other specified complication: Secondary | ICD-10-CM | POA: Diagnosis not present

## 2023-05-30 DIAGNOSIS — R198 Other specified symptoms and signs involving the digestive system and abdomen: Secondary | ICD-10-CM | POA: Diagnosis not present

## 2023-06-02 DIAGNOSIS — H43813 Vitreous degeneration, bilateral: Secondary | ICD-10-CM | POA: Diagnosis not present

## 2023-06-20 DIAGNOSIS — N3281 Overactive bladder: Secondary | ICD-10-CM | POA: Diagnosis not present

## 2023-06-20 DIAGNOSIS — N401 Enlarged prostate with lower urinary tract symptoms: Secondary | ICD-10-CM | POA: Diagnosis not present

## 2023-07-21 DIAGNOSIS — N400 Enlarged prostate without lower urinary tract symptoms: Secondary | ICD-10-CM | POA: Diagnosis not present

## 2023-07-28 DIAGNOSIS — M25552 Pain in left hip: Secondary | ICD-10-CM | POA: Diagnosis not present

## 2023-07-28 DIAGNOSIS — E7800 Pure hypercholesterolemia, unspecified: Secondary | ICD-10-CM | POA: Diagnosis not present

## 2023-07-28 DIAGNOSIS — N3281 Overactive bladder: Secondary | ICD-10-CM | POA: Diagnosis not present

## 2023-07-28 DIAGNOSIS — K219 Gastro-esophageal reflux disease without esophagitis: Secondary | ICD-10-CM | POA: Diagnosis not present

## 2023-07-28 DIAGNOSIS — E1140 Type 2 diabetes mellitus with diabetic neuropathy, unspecified: Secondary | ICD-10-CM | POA: Diagnosis not present

## 2023-07-28 DIAGNOSIS — N400 Enlarged prostate without lower urinary tract symptoms: Secondary | ICD-10-CM | POA: Diagnosis not present

## 2023-07-28 DIAGNOSIS — I10 Essential (primary) hypertension: Secondary | ICD-10-CM | POA: Diagnosis not present

## 2023-07-28 DIAGNOSIS — G629 Polyneuropathy, unspecified: Secondary | ICD-10-CM | POA: Diagnosis not present

## 2023-07-28 DIAGNOSIS — I83811 Varicose veins of right lower extremities with pain: Secondary | ICD-10-CM | POA: Diagnosis not present

## 2023-08-27 DIAGNOSIS — E7800 Pure hypercholesterolemia, unspecified: Secondary | ICD-10-CM | POA: Diagnosis not present

## 2023-08-27 DIAGNOSIS — E1140 Type 2 diabetes mellitus with diabetic neuropathy, unspecified: Secondary | ICD-10-CM | POA: Diagnosis not present

## 2023-10-20 DIAGNOSIS — L0291 Cutaneous abscess, unspecified: Secondary | ICD-10-CM | POA: Diagnosis not present

## 2023-11-20 DIAGNOSIS — R399 Unspecified symptoms and signs involving the genitourinary system: Secondary | ICD-10-CM | POA: Diagnosis not present

## 2023-11-20 DIAGNOSIS — R8290 Unspecified abnormal findings in urine: Secondary | ICD-10-CM | POA: Diagnosis not present

## 2023-11-26 ENCOUNTER — Other Ambulatory Visit (HOSPITAL_BASED_OUTPATIENT_CLINIC_OR_DEPARTMENT_OTHER): Payer: Self-pay

## 2023-12-01 DIAGNOSIS — H2513 Age-related nuclear cataract, bilateral: Secondary | ICD-10-CM | POA: Diagnosis not present

## 2023-12-10 DIAGNOSIS — N401 Enlarged prostate with lower urinary tract symptoms: Secondary | ICD-10-CM | POA: Diagnosis not present

## 2023-12-10 DIAGNOSIS — N138 Other obstructive and reflux uropathy: Secondary | ICD-10-CM | POA: Diagnosis not present

## 2024-01-07 DIAGNOSIS — N401 Enlarged prostate with lower urinary tract symptoms: Secondary | ICD-10-CM | POA: Diagnosis not present

## 2024-01-07 DIAGNOSIS — N138 Other obstructive and reflux uropathy: Secondary | ICD-10-CM | POA: Diagnosis not present

## 2024-01-20 DIAGNOSIS — N138 Other obstructive and reflux uropathy: Secondary | ICD-10-CM | POA: Diagnosis not present

## 2024-01-20 DIAGNOSIS — N401 Enlarged prostate with lower urinary tract symptoms: Secondary | ICD-10-CM | POA: Diagnosis not present

## 2024-01-26 DIAGNOSIS — D692 Other nonthrombocytopenic purpura: Secondary | ICD-10-CM | POA: Diagnosis not present

## 2024-01-26 DIAGNOSIS — L570 Actinic keratosis: Secondary | ICD-10-CM | POA: Diagnosis not present

## 2024-01-26 DIAGNOSIS — L820 Inflamed seborrheic keratosis: Secondary | ICD-10-CM | POA: Diagnosis not present

## 2024-01-26 DIAGNOSIS — L814 Other melanin hyperpigmentation: Secondary | ICD-10-CM | POA: Diagnosis not present

## 2024-01-26 DIAGNOSIS — L232 Allergic contact dermatitis due to cosmetics: Secondary | ICD-10-CM | POA: Diagnosis not present

## 2024-01-26 DIAGNOSIS — I8391 Asymptomatic varicose veins of right lower extremity: Secondary | ICD-10-CM | POA: Diagnosis not present

## 2024-01-26 DIAGNOSIS — L819 Disorder of pigmentation, unspecified: Secondary | ICD-10-CM | POA: Diagnosis not present

## 2024-01-29 ENCOUNTER — Other Ambulatory Visit: Payer: Self-pay

## 2024-01-30 MED ORDER — LOSARTAN POTASSIUM 25 MG PO TABS: 12.5000 mg | ORAL_TABLET | Freq: Every day | ORAL | 0 refills | Status: AC

## 2024-02-03 DIAGNOSIS — Z7901 Long term (current) use of anticoagulants: Secondary | ICD-10-CM | POA: Diagnosis not present

## 2024-02-03 DIAGNOSIS — R350 Frequency of micturition: Secondary | ICD-10-CM | POA: Diagnosis not present

## 2024-02-03 DIAGNOSIS — N399 Disorder of urinary system, unspecified: Secondary | ICD-10-CM | POA: Diagnosis not present

## 2024-02-03 DIAGNOSIS — Z79899 Other long term (current) drug therapy: Secondary | ICD-10-CM | POA: Diagnosis not present

## 2024-02-03 DIAGNOSIS — N401 Enlarged prostate with lower urinary tract symptoms: Secondary | ICD-10-CM | POA: Diagnosis not present

## 2024-02-03 DIAGNOSIS — N402 Nodular prostate without lower urinary tract symptoms: Secondary | ICD-10-CM | POA: Diagnosis not present

## 2024-02-04 ENCOUNTER — Ambulatory Visit (HOSPITAL_BASED_OUTPATIENT_CLINIC_OR_DEPARTMENT_OTHER): Admitting: Cardiology

## 2024-02-04 DIAGNOSIS — N401 Enlarged prostate with lower urinary tract symptoms: Secondary | ICD-10-CM | POA: Diagnosis not present

## 2024-02-04 DIAGNOSIS — N138 Other obstructive and reflux uropathy: Secondary | ICD-10-CM | POA: Diagnosis not present

## 2024-02-04 DIAGNOSIS — Z48816 Encounter for surgical aftercare following surgery on the genitourinary system: Secondary | ICD-10-CM | POA: Diagnosis not present

## 2024-02-19 DIAGNOSIS — E1169 Type 2 diabetes mellitus with other specified complication: Secondary | ICD-10-CM | POA: Diagnosis not present

## 2024-02-19 DIAGNOSIS — E7800 Pure hypercholesterolemia, unspecified: Secondary | ICD-10-CM | POA: Diagnosis not present

## 2024-02-19 DIAGNOSIS — Z125 Encounter for screening for malignant neoplasm of prostate: Secondary | ICD-10-CM | POA: Diagnosis not present

## 2024-02-24 DIAGNOSIS — E1169 Type 2 diabetes mellitus with other specified complication: Secondary | ICD-10-CM | POA: Diagnosis not present

## 2024-02-27 ENCOUNTER — Ambulatory Visit (HOSPITAL_BASED_OUTPATIENT_CLINIC_OR_DEPARTMENT_OTHER): Admitting: Cardiology

## 2024-03-29 ENCOUNTER — Encounter: Payer: Self-pay | Admitting: Cardiology

## 2024-03-29 ENCOUNTER — Ambulatory Visit: Attending: Cardiology | Admitting: Cardiology

## 2024-03-29 VITALS — BP 108/54 | HR 67 | Ht 75.0 in | Wt 206.8 lb

## 2024-03-29 DIAGNOSIS — E119 Type 2 diabetes mellitus without complications: Secondary | ICD-10-CM | POA: Diagnosis not present

## 2024-03-29 DIAGNOSIS — I10 Essential (primary) hypertension: Secondary | ICD-10-CM

## 2024-03-29 DIAGNOSIS — I83891 Varicose veins of right lower extremities with other complications: Secondary | ICD-10-CM | POA: Diagnosis not present

## 2024-03-29 DIAGNOSIS — D6851 Activated protein C resistance: Secondary | ICD-10-CM

## 2024-03-29 DIAGNOSIS — E782 Mixed hyperlipidemia: Secondary | ICD-10-CM | POA: Diagnosis not present

## 2024-03-29 DIAGNOSIS — I493 Ventricular premature depolarization: Secondary | ICD-10-CM | POA: Diagnosis not present

## 2024-03-29 NOTE — Patient Instructions (Signed)

## 2024-03-29 NOTE — Progress Notes (Signed)
 " Cardiology Office Note:  .   Date:  03/29/2024  ID:  Todd Gardner, DOB 1956/02/09, MRN [id number] PCP: Seabron Lenis, MD  [facility] Providers Cardiologist:  Oneil Parchment, MD     History of Present Illness: .   Todd Gardner is a 69 y.o. male Discussed the use of AI scribe  History of Present Illness Todd Gardner is a 69 year old male with PVCs, factor V Leiden deficiency, and prediabetes who presents for follow-up.  He experiences frequent unifocal premature ventricular contractions (PVCs) without recent episodes of chest pain or dizziness.  He has a history of factor V Leiden deficiency and was previously on Eliquis for anticoagulation but switched to warfarin due to hair loss. He is currently back on Eliquis, which he finds more convenient, especially during the COVID-19 pandemic as it reduced the need for frequent blood tests. He has a history of blood clots, with the first occurrence 30 years ago, followed by a second one shortly after, both in the right lower extremity. He also had two varicose ulcers on his right leg, which have been managed with anticoagulation therapy.  He is transitioning from prediabetes to diabetes, with a recent hemoglobin A1c of 7.3. He has attempted dietary changes and increased physical activity but has not seen significant improvement in his A1c levels.  He experienced epistaxis on March 13, 2022, and noted slight dizziness when his blood pressure was 103/50.  He is on losartan  12.5 mg for blood pressure management and has recently added magnesium glycinate to address leg cramps, which has been effective.       Studies Reviewed: SABRA   EKG Interpretation Date/Time:  Monday March 29 2024 16:35:21 EST Ventricular Rate:  67 PR Interval:  136 QRS Duration:  94 QT Interval:  380 QTC Calculation: 401 R Axis:   60  Text Interpretation: Sinus rhythm with occasional Premature ventricular complexes No previous ECGs available Confirmed by  Parchment Oneil ([id number]) on 03/29/2024 4:43:07 PM    Results Labs LDL: 83 Hemoglobin: 14.2 Hemoglobin A1c (02/2024): 7.3 Risk Assessment/Calculations:            Physical Exam:   VS:  BP (!) 108/54 (BP Location: Left Arm, Patient Position: Sitting, Cuff Size: Normal)   Pulse 67   Ht 6' 3 (1.905 m)   Wt 206 lb 12.8 oz (93.8 kg)   SpO2 97%   BMI 25.85 kg/m    Wt Readings from Last 3 Encounters:  03/29/24 206 lb 12.8 oz (93.8 kg)  03/05/23 207 lb 6.4 oz (94.1 kg)  02/04/23 207 lb (93.9 kg)    GEN: Well nourished, well developed in no acute distress NECK: No JVD; No carotid bruits CARDIAC: RRR, no murmurs, no rubs, no gallops RESPIRATORY:  Clear to auscultation without rales, wheezing or rhonchi  ABDOMEN: Soft, non-tender, non-distended EXTREMITIES:  No edema; No deformity   ASSESSMENT AND PLAN: .    Assessment and Plan Assessment & Plan Premature ventricular contractions Unifocal PVCs are frequent but asymptomatic with no chest pain or palpitations.  Factor V Leiden deficiency with history of venous thrombosis Factor V Leiden deficiency managed with Eliquis. Previous venous thrombosis in the right lower extremity. Eliquis preferred over warfarin due to ease of use and reduced need for frequent blood tests. - Continue Eliquis for anticoagulation.  Varicose veins with history of ulcer, right lower extremity Varicose veins with previous ulcers on the right leg.  Essential hypertension Blood pressure is well-controlled with Losartan  12.5  mg daily. Recent reading was 121/66 mmHg. - Continue Losartan  12.5 mg daily. Renal protective.   Type 2 diabetes mellitus A1c is 7.3%, indicating progression to diabetes. Previous dietary modifications were unhelpful due to gastrointestinal side effects. Exercise has been increased but A1c remains elevated. Discussed potential use of metformin and other diabetic agents like Jardiance, which may also benefit cardiovascular health. - Continue to  discuss potential use of metformin or other diabetic agents with primary care physician.  Palpitations Magnesium glycine 8 has helped out significantly.  Recommended by his urologist.       Dispo: 1 yr  Signed, Oneil Parchment, MD  "
